# Patient Record
Sex: Female | Born: 1985 | Race: White | Hispanic: No | Marital: Married | Smoking: Never smoker
Health system: Southern US, Community
[De-identification: ages and names within clinical notes are randomized; demographics above are authoritative.]

## PROBLEM LIST (undated history)

## (undated) DIAGNOSIS — I10 Essential (primary) hypertension: Secondary | ICD-10-CM

---

## 2009-02-11 ENCOUNTER — Ambulatory Visit: Payer: Self-pay | Admitting: Family Medicine

## 2009-02-11 DIAGNOSIS — J309 Allergic rhinitis, unspecified: Secondary | ICD-10-CM | POA: Insufficient documentation

## 2009-02-11 DIAGNOSIS — F411 Generalized anxiety disorder: Secondary | ICD-10-CM | POA: Insufficient documentation

## 2009-02-11 DIAGNOSIS — E0590 Thyrotoxicosis, unspecified without thyrotoxic crisis or storm: Secondary | ICD-10-CM | POA: Insufficient documentation

## 2009-02-11 DIAGNOSIS — J069 Acute upper respiratory infection, unspecified: Secondary | ICD-10-CM | POA: Insufficient documentation

## 2009-02-11 DIAGNOSIS — M940 Chondrocostal junction syndrome [Tietze]: Secondary | ICD-10-CM | POA: Insufficient documentation

## 2009-02-11 DIAGNOSIS — I10 Essential (primary) hypertension: Secondary | ICD-10-CM | POA: Insufficient documentation

## 2009-05-22 ENCOUNTER — Ambulatory Visit: Payer: Self-pay | Admitting: Occupational Medicine

## 2009-05-22 DIAGNOSIS — J45901 Unspecified asthma with (acute) exacerbation: Secondary | ICD-10-CM | POA: Insufficient documentation

## 2009-11-09 ENCOUNTER — Ambulatory Visit: Payer: Self-pay | Admitting: Family Medicine

## 2009-11-09 DIAGNOSIS — M26629 Arthralgia of temporomandibular joint, unspecified side: Secondary | ICD-10-CM | POA: Insufficient documentation

## 2009-11-09 DIAGNOSIS — J0100 Acute maxillary sinusitis, unspecified: Secondary | ICD-10-CM | POA: Insufficient documentation

## 2009-11-09 DIAGNOSIS — J029 Acute pharyngitis, unspecified: Secondary | ICD-10-CM | POA: Insufficient documentation

## 2009-11-09 LAB — CONVERTED CEMR LAB: Rapid Strep: NEGATIVE

## 2009-11-10 ENCOUNTER — Encounter: Payer: Self-pay | Admitting: Family Medicine

## 2009-11-19 ENCOUNTER — Ambulatory Visit: Payer: Self-pay | Admitting: Emergency Medicine

## 2009-11-21 ENCOUNTER — Encounter: Admission: RE | Admit: 2009-11-21 | Discharge: 2009-11-21 | Payer: Self-pay | Admitting: Family Medicine

## 2009-12-21 ENCOUNTER — Ambulatory Visit: Payer: Self-pay | Admitting: Family Medicine

## 2009-12-21 DIAGNOSIS — M79609 Pain in unspecified limb: Secondary | ICD-10-CM | POA: Insufficient documentation

## 2009-12-22 ENCOUNTER — Encounter: Payer: Self-pay | Admitting: Family Medicine

## 2010-03-05 ENCOUNTER — Ambulatory Visit: Payer: Self-pay | Admitting: Emergency Medicine

## 2010-03-05 DIAGNOSIS — S060X0A Concussion without loss of consciousness, initial encounter: Secondary | ICD-10-CM | POA: Insufficient documentation

## 2010-03-10 ENCOUNTER — Telehealth (INDEPENDENT_AMBULATORY_CARE_PROVIDER_SITE_OTHER): Payer: Self-pay

## 2010-04-11 ENCOUNTER — Encounter: Payer: Self-pay | Admitting: Emergency Medicine

## 2010-05-15 NOTE — Assessment & Plan Note (Signed)
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 SULFA ! * LATEX ! * ALOE ! * CATSHistory of Present Illness Chief Complaint: Fever, Chest tightness, feels like needles in chest when coughs, History of Present Illness: Presents with complaints of chest tightness and wheezing after going to a party at her friends house with a cat.   She has a previous history of cat allergy.   Benedryl and albuterol MDI have not helped.    Lots of dry coughing.  Some minor sinus congestion.   She does have a history of mild intermittent asthma.   Current Meds ALPRAZOLAM 0.25 MG TABS (ALPRAZOLAM) as needed HYDROCHLOROTHIAZIDE 25 MG TABS (HYDROCHLOROTHIAZIDE) 1 tab by mouth once daily VALACYCLOVIR HCL 500 MG TABS (VALACYCLOVIR HCL) AS needed PROAIR HFA 108 (90 BASE) MCG/ACT AERS (ALBUTEROL SULFATE) Two inhalations q4-6hr as needed.  Max 12 puffs/day PREDNISONE 20 MG TABS (PREDNISONE) 60mg  by mouth once daily for 2 days then 40 mg by mouth once daily for 2 days then 20 mg by mouth once daily for 2 days.  REVIEW OF SYSTEMS Constitutional Symptoms       Complains of fever.     Denies chills, night sweats, weight loss, weight gain, and fatigue.  Eyes       Complains of glasses.      Denies change in vision, eye pain, eye discharge, contact lenses, and eye surgery. Ear/Nose/Throat/Mouth       Complains of hoarseness.      Denies hearing loss/aids, change in hearing, ear pain, ear discharge, dizziness,  frequent runny nose, frequent nose bleeds, sinus problems, sore throat, and tooth pain or bleeding.  Respiratory       Complains of dry cough and shortness of breath.      Denies productive cough, wheezing, asthma, bronchitis, and emphysema/COPD.  Cardiovascular       Complains of chest pain.      Denies murmurs and tires easily with exhertion.    Gastrointestinal       Denies stomach pain, nausea/vomiting, diarrhea, constipation, blood in bowel movements, and indigestion. Genitourniary       Denies painful urination, kidney stones, and loss of urinary control. Neurological       Denies paralysis, seizures, and fainting/blackouts. Musculoskeletal       Denies muscle pain, joint pain, joint stiffness, decreased range of motion, redness, swelling, muscle weakness, and gout.  Skin       Denies bruising, unusual mles/lumps or sores, and hair/skin or nail changes.  Psych       Denies mood changes, temper/anger issues, anxiety/stress, speech problems, depression, and sleep problems.  Past History:  Past Medical History: Reviewed history from 02/11/2009 and no changes  required. Allergic rhinitis Cold Sores Anxiety Hypertension  Past Surgical History: Reviewed history from 02/11/2009 and no changes required. Oral Surgery  Family History: Reviewed history from 02/11/2009 and no changes required.   Family History Hypertension Family History Diabetes 1st degree relative  Social History: Reviewed history from 02/11/2009 and no changes required. Single Never Smoked Alcohol use-yes 1 drink weekly Drug use-no Regular exercise-yes Physical Exam General appearance: well developed, well nourished, no acute distress Head: normocephalic, atraumatic Nasal: mucosa pink, nonedematous, no septal deviation, turbinates normal Oral/Pharynx: tongue normal, posterior pharynx without erythema or exudate Chest/Lungs: no rales, wheezes, or rhonchi bilateral, breath sounds equal without  effort Heart: regular rate and  rhythm, no murmur Assessment New Problems: ASTHMA UNSPECIFIED WITH EXACERBATION (ICD-493.92)   Plan New Medications/Changes: PREDNISONE 20 MG TABS (PREDNISONE) 60mg  by mouth once daily for 2 days then 40 mg by mouth once daily for 2 days then 20 mg by mouth once daily for 2 days.  #QS x 0, 05/22/2009, Kathrine Haddock MD  New Orders: Est. Patient Level III 502 150 5169 Planning Comments:   Zyrtec as dirrected (already has at home) Continue albuterol MDI as needed Prednisone taper Follow up as needed.   The patient and/or caregiver has been counseled thoroughly with regard to medications prescribed including dosage, schedule, interactions, rationale for use, and possible side effects and they verbalize understanding.  Diagnoses and expected course of recovery discussed and will return if not improved as expected or if the condition worsens. Patient and/or caregiver verbalized understanding.  Prescriptions: PREDNISONE 20 MG TABS (PREDNISONE) 60mg  by mouth once daily for 2 days then 40 mg by mouth once daily for 2 days then 20 mg by mouth once daily for 2 days.  #QS x 0   Entered and Authorized by:   Kathrine Haddock MD   Signed by:   Kathrine Haddock MD on 05/22/2009   Method used:   Print then Give to Patient   RxID:   [id number]

## 2010-05-15 NOTE — Assessment & Plan Note (Signed)
Summary: Ear pain-B, HA, Cough-green, sorethroat x 4 dys rm 2   Vital Signs:  Patient Profile:   25 Years Old Female CC:      Cold & URI symptoms Height:     65 inches Weight:      190 pounds O2 Sat:      99 % O2 treatment:    154Room Air Temp:     99.8 degrees F oral Pulse rate:   104 / minute Pulse rhythm:   irregular Resp:     16 per minute BP sitting:   154 / 89  (right arm) Cuff size:   regular  Vitals Entered By: Areta Haber CMA (November 09, 2009 6:46 PM)                  Current Allergies: ! SULFA ! * LATEX ! * ALOE ! * CATS       History of Present Illness Chief Complaint: Cold & URI symptoms History of Present Illness:  Subjective: Patient complains of developing a sore throat one week ago that has persisted, followed by sinus congestion and bilateral earache, worse on the right.  She developed a cough 4 days ago, worse at night.  She states that she has had mono in the past.   No pleuritic pain No wheezing + post-nasal drainage + sinus pain/pressure No itchy/red eyes No hemoptysis No SOB No fever, + chills No nausea No vomiting No abdominal pain No diarrhea No skin rashes + fatigue No myalgias + frontal headache Used OTC meds without relief   Current Problems: TMJ PAIN (ICD-524.62) ACUTE MAXILLARY SINUSITIS (ICD-461.0) ACUTE PHARYNGITIS (ICD-462) ASTHMA UNSPECIFIED WITH EXACERBATION (ICD-493.92) URI (ICD-465.9) COSTOCHONDRITIS, ACUTE (ICD-733.6) FAMILY HISTORY DIABETES 1ST DEGREE RELATIVE (ICD-V18.0) FAMILY HISTORY DEPRESSION (ICD-V17.0) HYPERTENSION (ICD-401.9) ANXIETY (ICD-300.00) HYPERTHYROIDISM (ICD-242.90) ALLERGIC RHINITIS (ICD-477.9)   Current Meds ALPRAZOLAM 0.25 MG TABS (ALPRAZOLAM) as needed HYDROCHLOROTHIAZIDE 25 MG TABS (HYDROCHLOROTHIAZIDE) 1 tab by mouth once daily VALACYCLOVIR HCL 500 MG TABS (VALACYCLOVIR HCL) AS needed DIMETAPP COLD/ALLERGY 1-2.5 MG/5ML ELIX (BROMPHENIRAMINE-PHENYLEPHRINE) as  directed ROBITUSSIN DM 100-10 MG/5ML SYRP (DEXTROMETHORPHAN-GUAIFENESIN) as directed ADVIL 200 MG TABS (IBUPROFEN) as directed AMOXICILLIN 875 MG TABS (AMOXICILLIN) One by mouth two times a day BENZONATATE 200 MG CAPS (BENZONATATE) One by mouth hs as needed cough PREDNISONE 10 MG TABS (PREDNISONE) 2 PO BID for 2 days, then 1 BID for 2 days, then 1 daily for 2 days.  Take PC  REVIEW OF SYSTEMS Constitutional Symptoms      Denies fever, chills, night sweats, weight loss, weight gain, and fatigue.  Eyes       Denies change in vision, eye pain, eye discharge, glasses, contact lenses, and eye surgery. Ear/Nose/Throat/Mouth       Complains of ear pain, sore throat, and hoarseness.      Denies hearing loss/aids, change in hearing, ear discharge, dizziness, frequent runny nose, frequent nose bleeds, sinus problems, and tooth pain or bleeding.      Comments: B x 4 dys Respiratory       Complains of productive cough.      Denies dry cough, wheezing, shortness of breath, asthma, bronchitis, and emphysema/COPD.  Cardiovascular       Denies murmurs, chest pain, and tires easily with exhertion.    Gastrointestinal       Denies stomach pain, nausea/vomiting, diarrhea, constipation, blood in bowel movements, and indigestion. Genitourniary       Denies painful urination, kidney stones, and loss of urinary control. Neurological  Complains of headaches.      Denies paralysis, seizures, and fainting/blackouts. Musculoskeletal       Denies muscle pain, joint pain, joint stiffness, decreased range of motion, redness, swelling, muscle weakness, and gout.  Skin       Denies bruising, unusual mles/lumps or sores, and hair/skin or nail changes.  Psych       Denies mood changes, temper/anger issues, anxiety/stress, speech problems, depression, and sleep problems. Other Comments: greenish yellowx 4 dys. Pt has not seen her PCP for this.   Past History:  Past Medical History: Last updated:  02/11/2009 Allergic rhinitis Cold Sores Anxiety Hypertension  Past Surgical History: Last updated: 02/11/2009 Oral Surgery  Family History: Last updated: 02/11/2009   Family History Hypertension Family History Diabetes 1st degree relative  Social History: Last updated: 02/11/2009 Single Never Smoked Alcohol use-yes 1 drink weekly Drug use-no Regular exercise-yes  Risk Factors: Exercise: yes (02/11/2009)  Risk Factors: Smoking Status: never (02/11/2009)   Objective:  No acute distress  Eyes:  Pupils are equal, round, and reactive to light and accomdation.  Extraocular movement is intact.  Conjunctivae are not inflamed.  Ears:  Canals normal.  Tympanic membranes normal.  Bilateral TMJ tenderness, distinctly worse on the right Nose:  Congested turbinates.  Bilateral maxillary sinus tenderness present.  Pharynx:  Mildly erythematous Neck:  Supple.  Tender shotty anterior/posterior nodes are palpated bilaterally.  Lungs:  Clear to auscultation.  Breath sounds are equal.  Heart:  Regular rate and rhythm without murmurs, rubs, or gallops.  Abdomen:   Tenderness over spleen without masses or hepatosplenomegaly.  Bowel sounds are present.  No CVA or flank tenderness.  Extremities:  No edema.   Skin:  No rash Assessment New Problems: TMJ PAIN (ICD-524.62) ACUTE MAXILLARY SINUSITIS (ICD-461.0) ACUTE PHARYNGITIS (ICD-462)  SUSPECT VIRAL URI WITH REACTIVATED MONO.  SECONDARY SINUSITIS PRESENT.  BILATERAL TMJ INFLAMMATION  Plan New Medications/Changes: PREDNISONE 10 MG TABS (PREDNISONE) 2 PO BID for 2 days, then 1 BID for 2 days, then 1 daily for 2 days.  Take PC  #14 x 0, 11/09/2009, Donna Christen MD BENZONATATE 200 MG CAPS (BENZONATATE) One by mouth hs as needed cough  #12 x 0, 11/09/2009, Donna Christen MD AMOXICILLIN 875 MG TABS (AMOXICILLIN) One by mouth two times a day  #20 x 0, 11/09/2009, Donna Christen MD  New Orders: Rapid Strep [87880] T-Culture, Throat  [[id number]] T-Culture, Throat [[id number]] Est. Patient Level IV [06301] Planning Comments:   Throat culture pending Begin amoxicillin and tapering course of prednisone.  Expectorant and topical decongestant.  Cough suppressant at bedtime. Apply ice pack to TMJ several times daily.  Given a Water quality scientist patient information and instruction sheet on topic TMJ.  Recommend follow-up visit with dentist. Advised to resume her medication for hypertension. Follow-up with PCP if not improving.   The patient and/or caregiver has been counseled thoroughly with regard to medications prescribed including dosage, schedule, interactions, rationale for use, and possible side effects and they verbalize understanding.  Diagnoses and expected course of recovery discussed and will return if not improved as expected or if the condition worsens. Patient and/or caregiver verbalized understanding.  Prescriptions: PREDNISONE 10 MG TABS (PREDNISONE) 2 PO BID for 2 days, then 1 BID for 2 days, then 1 daily for 2 days.  Take PC  #14 x 0   Entered and Authorized by:   Donna Christen MD   Signed by:   Donna Christen MD on 11/09/2009   Method used:   Print then  Give to Patient   RxID:   [id number] BENZONATATE 200 MG CAPS (BENZONATATE) One by mouth hs as needed cough  #12 x 0   Entered and Authorized by:   Donna Christen MD   Signed by:   Donna Christen MD on 11/09/2009   Method used:   Print then Give to Patient   RxID:   [id number] AMOXICILLIN 875 MG TABS (AMOXICILLIN) One by mouth two times a day  #20 x 0   Entered and Authorized by:   Donna Christen MD   Signed by:   Donna Christen MD on 11/09/2009   Method used:   Print then Give to Patient   RxID:   [id number]   Patient Instructions: 1)  Take Mucinex  (guaifenesin) twice daily for congestion. 2)  Increase fluid intake, rest. 3)  May use Afrin nasal spray (or generic oxymetazoline) twice daily for about 5 days.  Also recommend using saline nasal spray  several times daily and/or saline nasal irrigation. 4)  Followup with family doctor if not improving 7 to 10 days.  Orders Added: 1)  Rapid Strep [87880] 2)  T-Culture, Throat [[id number]] 3)  T-Culture, Throat [[id number]] 4)  Est. Patient Level IV [50093]  Laboratory Results  Date/Time Received: November 09, 2009 7:55 PM  Date/Time Reported: November 09, 2009 7:55 PM   Other Tests  Rapid Strep: negative

## 2010-05-15 NOTE — Assessment & Plan Note (Signed)
Summary: HA x 2 dys rm 5   Vital Signs:  Patient Profile:   25 Years Old Female CC:      Head pain x 2 dys Height:     65 inches Weight:      197 pounds O2 Sat:      98 % O2 treatment:    Room Air Temp:     99.2 degrees F oral Pulse rate:   78 / minute Pulse rhythm:   regular Resp:     16 per minute BP sitting:   127 / 84  (left arm) Cuff size:   regular  Vitals Entered By: Areta Haber CMA (March 05, 2010 3:56 PM)              Vision Screening: Left eye with correction: 20 / 25 Right eye with correction: 20 / 25 Both eyes with correction: 20 / 25        Vision Entered By: Areta Haber CMA (March 05, 2010 4:18 PM)    Current Allergies: ! SULFA ! * LATEX ! * ALOE ! * CATS       History of Present Illness History from: patient Chief Complaint: Head pain x 2 dys History of Present Illness: Patient was wrestling with her boyfriend 2 nights ago and hit her head on the bottom of a cabinet when she was getting up.  Hit the top of of her head and has had a headache since.  It is getting a little better but she also has other symptoms that she is concerned with: insomnia, nausea, diarrhea, photophobia, difficulty concentrating, etc.  She has never had a concussion before, no previous neck or head injuries.  There was no LOC. She has no numbness, weakness, or visual symptoms.  Current Problems: CONCUSSION WITH NO LOSS OF CONSCIOUSNESS (ICD-850.0) FOOT PAIN, LEFT (ICD-729.5) TMJ PAIN (ICD-524.62) ACUTE MAXILLARY SINUSITIS (ICD-461.0) ACUTE PHARYNGITIS (ICD-462) ASTHMA UNSPECIFIED WITH EXACERBATION (ICD-493.92) URI (ICD-465.9) COSTOCHONDRITIS, ACUTE (ICD-733.6) FAMILY HISTORY DIABETES 1ST DEGREE RELATIVE (ICD-V18.0) FAMILY HISTORY DEPRESSION (ICD-V17.0) HYPERTENSION (ICD-401.9) ANXIETY (ICD-300.00) HYPERTHYROIDISM (ICD-242.90) ALLERGIC RHINITIS (ICD-477.9)   Current Meds ALPRAZOLAM 0.25 MG TABS (ALPRAZOLAM) as needed HYDROCHLOROTHIAZIDE 25 MG  TABS (HYDROCHLOROTHIAZIDE) 1 tab by mouth once daily VALACYCLOVIR HCL 500 MG TABS (VALACYCLOVIR HCL) AS needed ADVIL 200 MG TABS (IBUPROFEN) as directed NAPROXEN 500 MG TABS (NAPROXEN) One by mouth two times a day pc  REVIEW OF SYSTEMS Constitutional Symptoms      Denies fever, chills, night sweats, weight loss, weight gain, and fatigue.  Eyes       Complains of eye pain.      Denies change in vision, eye discharge, glasses, contact lenses, and eye surgery.      Comments: R x 2dys Ear/Nose/Throat/Mouth       Denies hearing loss/aids, change in hearing, ear pain, ear discharge, dizziness, frequent runny nose, frequent nose bleeds, sinus problems, sore throat, hoarseness, and tooth pain or bleeding.  Respiratory       Denies dry cough, productive cough, wheezing, shortness of breath, asthma, bronchitis, and emphysema/COPD.  Cardiovascular       Denies murmurs, chest pain, and tires easily with exhertion.    Gastrointestinal       Complains of diarrhea.      Denies stomach pain, nausea/vomiting, constipation, blood in bowel movements, and indigestion.      Comments: mild Genitourniary       Denies painful urination, kidney stones, and loss of urinary control. Neurological  Complains of headaches.      Denies paralysis, seizures, and fainting/blackouts. Musculoskeletal       Denies muscle pain, joint pain, joint stiffness, decreased range of motion, redness, swelling, muscle weakness, and gout.  Skin       Denies bruising, unusual mles/lumps or sores, and hair/skin or nail changes.  Psych       Denies mood changes, temper/anger issues, anxiety/stress, speech problems, depression, and sleep problems. Other Comments: Pt states that she was wrestling with boyfriend on Saturday night, 03/03/10, hit the center of her head on cabinet. Pt states she feels as if she's having to make her R eye stay open. Pt has not seen her PCP or went to ER for evaluation.   Past History:  Past Medical  History: Last updated: 02/11/2009 Allergic rhinitis Cold Sores Anxiety Hypertension  Past Surgical History: Last updated: 02/11/2009 Oral Surgery  Family History: Last updated: 02/11/2009   Family History Hypertension Family History Diabetes 1st degree relative  Social History: Last updated: 02/11/2009 Single Never Smoked Alcohol use-yes 1 drink weekly Drug use-no Regular exercise-yes  Risk Factors: Exercise: yes (02/11/2009)  Risk Factors: Smoking Status: never (02/11/2009) Physical Exam General appearance: well developed, well nourished, no acute distress Head: normocephalic, atraumatic.  No bumps or bruises noticed. Pupils: PERRLA, EOMI Ears: normal, no lesions or deformities Nasal: mucosa pink, nonedematous, no septal deviation, turbinates normal Oral/Pharynx: tongue normal, posterior pharynx without erythema or exudate Neck: FROM, spurling neg. Chest/Lungs: no rales, wheezes, or rhonchi bilateral, breath sounds equal without effort Heart: regular rate and  rhythm, no murmur Neurological: grossly intact and non-focal.  CN2-12 intact.  Normal strength and sensation all 4 extremities. Skin: no obvious rashes or lesions MSE: oriented to time, place, and person SCAT2 partial concussion evaluation (no baseline is available): Symptoms 18/22 for a total of 38.  Unable to do a proper BESS balance assessment, but gait and balance appear normal.  GCS is 15/15.  Her cognitive assessment showed no obvious impairment with memory or orientation or concentration. Assessment New Problems: CONCUSSION WITH NO LOSS OF CONSCIOUSNESS (ICD-850.0)   Patient Education: Patient and/or caregiver instructed in the following: rest, fluids, Tylenol prn.  Plan New Orders: Est. Patient Level IV [30865] Planning Comments:   Advised patient to rest as much as possible over the next few days. 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  Avoid  stress (emotional, mental, cognitive).  Use Tylenol as needed, can see if caffeine helps as well. For insomnia, suggest Benedryl +/- Melatonin.  If can't sleep still, we will call in "Ambien 5mg  at bedtime as needed for insomnia, #5, no refills".  Her symptoms should slowly improve.  If still not improving substatially in 1 week, patient to be referred to neurology.  If any further worsening of symptoms (HA, confusion, weakness, etc), go directly to ER.   The patient and/or caregiver has been counseled thoroughly with regard to medications prescribed including dosage, schedule, interactions, rationale for use, and possible side effects and they verbalize understanding.  Diagnoses and expected course of recovery discussed and will return if not improved as expected or if the condition worsens. Patient and/or caregiver verbalized understanding.   Orders Added: 1)  Est. Patient Level IV [78469]

## 2010-05-15 NOTE — Progress Notes (Signed)
Summary: Courtesy call  Phone Note Outgoing Call   Call placed by: Areta Haber CMA,  March 10, 2010 12:31 PM Call placed to: Patient Summary of Call: Courtesy call to pt - Courtesy mess LOVM of cell number. Initial call taken by: Areta Haber CMA,  March 10, 2010 12:32 PM

## 2010-05-15 NOTE — Assessment & Plan Note (Signed)
Summary: L foot pain x 2 dys rm 5   Vital Signs:  Patient Profile:   25 Years Old Female CC:      L foot pain x 2 dys Height:     65 inches Weight:      193 pounds O2 Sat:      99 % O2 treatment:    Room Air Temp:     99.1 degrees F oral Pulse rate:   82 / minute Pulse rhythm:   regular Resp:     16 per minute BP sitting:   144 / 87  (left arm) Cuff size:   large  Vitals Entered By: Areta Haber CMA (December 21, 2009 4:35 PM)                  Current Allergies: ! SULFA ! * LATEX ! * ALOE ! * CATS      History of Present Illness Chief Complaint: L foot pain x 2 dys History of Present Illness:    Subjective:  Patient awoke two days ago with pain in her left foot that has persisted.  The pain is worse with weight bearing.  No known trauma.  No recent change in activities or new footwear.  She has a family history of gout in a paternal uncle.   Current Problems: FOOT PAIN, LEFT (ICD-729.5) TMJ PAIN (ICD-524.62) ACUTE MAXILLARY SINUSITIS (ICD-461.0) ACUTE PHARYNGITIS (ICD-462) ASTHMA UNSPECIFIED WITH EXACERBATION (ICD-493.92) URI (ICD-465.9) COSTOCHONDRITIS, ACUTE (ICD-733.6) FAMILY HISTORY DIABETES 1ST DEGREE RELATIVE (ICD-V18.0) FAMILY HISTORY DEPRESSION (ICD-V17.0) HYPERTENSION (ICD-401.9) ANXIETY (ICD-300.00) HYPERTHYROIDISM (ICD-242.90) ALLERGIC RHINITIS (ICD-477.9)   Current Meds ALPRAZOLAM 0.25 MG TABS (ALPRAZOLAM) as needed HYDROCHLOROTHIAZIDE 25 MG TABS (HYDROCHLOROTHIAZIDE) 1 tab by mouth once daily VALACYCLOVIR HCL 500 MG TABS (VALACYCLOVIR HCL) AS needed ADVIL 200 MG TABS (IBUPROFEN) as directed NAPROXEN 500 MG TABS (NAPROXEN) One by mouth two times a day pc  REVIEW OF SYSTEMS Constitutional Symptoms      Denies fever, chills, night sweats, weight loss, weight gain, and fatigue.  Eyes       Denies change in vision, eye pain, eye discharge, glasses, contact lenses, and eye surgery. Ear/Nose/Throat/Mouth       Denies hearing loss/aids,  change in hearing, ear pain, ear discharge, dizziness, frequent runny nose, frequent nose bleeds, sinus problems, sore throat, hoarseness, and tooth pain or bleeding.  Respiratory       Denies dry cough, productive cough, wheezing, shortness of breath, asthma, bronchitis, and emphysema/COPD.  Cardiovascular       Denies murmurs, chest pain, and tires easily with exhertion.    Gastrointestinal       Denies stomach pain, nausea/vomiting, diarrhea, constipation, blood in bowel movements, and indigestion. Genitourniary       Denies painful urination, kidney stones, and loss of urinary control. Neurological       Denies paralysis, seizures, and fainting/blackouts. Musculoskeletal       Complains of muscle pain, decreased range of motion, redness, and swelling.      Denies joint pain, joint stiffness, muscle weakness, and gout.      Comments: L foot pain x 2 dys Skin       Denies bruising, unusual mles/lumps or sores, and hair/skin or nail changes.  Psych       Denies mood changes, temper/anger issues, anxiety/stress, speech problems, depression, and sleep problems. Other Comments: Pt states she has not done anything to have L foot pain. Pt has not seen her PCP for this.   Past History:  Past Medical History: Last updated: 02/11/2009 Allergic rhinitis Cold Sores Anxiety Hypertension  Past Surgical History: Last updated: 02/11/2009 Oral Surgery  Family History: Last updated: 02/11/2009   Family History Hypertension Family History Diabetes 1st degree relative  Social History: Last updated: 02/11/2009 Single Never Smoked Alcohol use-yes 1 drink weekly Drug use-no Regular exercise-yes  Risk Factors: Exercise: yes (02/11/2009)  Risk Factors: Smoking Status: never (02/11/2009)   Objective:  No acute distress  Left ankle:  Full range of motion without tenderness Left foot:  Diffuse tenderness throughout forefoot, especially when squeezing foot.  Very mild swelling.   Possibly some increased warmth.  Distal neurovascular intact  X-ray left foot:  Soft tissue swelling but no bony abnormality. CBC:  WBC 9.5; Hgb 12.9 Assessment New Problems: FOOT PAIN, LEFT (ICD-729.5)  SUSPECT ACUTE GOUT.  ? METATARSALGIA  Plan New Medications/Changes: NAPROXEN 500 MG TABS (NAPROXEN) One by mouth two times a day pc  #20 x 1, 12/21/2009, Donna Christen MD  New Orders: T-DG Foot Complete*L* [73630] CBC w/Diff [[address]109-60454] T-Uric Acid (Blood) [09811-91478] Est. Patient Level III [29562] Planning Comments:   Begin Naproxen.  Apply ice pack as needed.  Wear well-fitting comfortable shoes with arch support ([employer] information and instruction patient handout given,  and a [employer] patient information and instruction sheet on topic gout) Uric Acid pending. Follow-up with PCP if not improving.   The patient and/or caregiver has been counseled thoroughly with regard to medications prescribed including dosage, schedule, interactions, rationale for use, and possible side effects and they verbalize understanding.  Diagnoses and expected course of recovery discussed and will return if not improved as expected or if the condition worsens. Patient and/or caregiver verbalized understanding.  Prescriptions: NAPROXEN 500 MG TABS (NAPROXEN) One by mouth two times a day pc  #20 x 1   Entered and Authorized by:   Donna Christen MD   Signed by:   Donna Christen MD on 12/21/2009   Method used:   Print then Give to Patient   RxID:   [id number]   Orders Added: 1)  T-DG Foot Complete*L* [73630] 2)  CBC w/Diff [84132-44010] 3)  T-Uric Acid (Blood) [27253-66440] 4)  Est. Patient Level III [34742]

## 2010-05-15 NOTE — Assessment & Plan Note (Signed)
Summary: Sinus pressure, cough-greenish x 3 dys  rm 3   Vital Signs:  Patient Profile:   25 Years Old Female CC:      Cold & URI symptoms Height:     65 inches Weight:      191 pounds O2 Sat:      99 % O2 treatment:    Room Air Temp:     98.2 degrees F oral Pulse rate:   118 / minute Pulse rhythm:   regular Resp:     16 per minute BP sitting:   179 / 98  (right arm) Cuff size:   regular  Vitals Entered By: Areta Haber CMA (November 19, 2009 3:06 PM)                  Current Allergies: ! SULFA ! * LATEX ! * ALOE ! * CATS     History of Present Illness Chief Complaint: Cold & URI symptoms History of Present Illness: 3 weeks sinus infection.  Starting getting better, then got worse.  Pain has migrated from throat to face, forehead, sinuses.  No fever, chills, N/V.  +Coughing.  She took the Amox and the Prednisone but not the Tessalon.  She is taking OTC Advil cold & sinus relief which is helping.  Current Problems: TMJ PAIN (ICD-524.62) ACUTE MAXILLARY SINUSITIS (ICD-461.0) ACUTE PHARYNGITIS (ICD-462) ASTHMA UNSPECIFIED WITH EXACERBATION (ICD-493.92) URI (ICD-465.9) COSTOCHONDRITIS, ACUTE (ICD-733.6) FAMILY HISTORY DIABETES 1ST DEGREE RELATIVE (ICD-V18.0) FAMILY HISTORY DEPRESSION (ICD-V17.0) HYPERTENSION (ICD-401.9) ANXIETY (ICD-300.00) HYPERTHYROIDISM (ICD-242.90) ALLERGIC RHINITIS (ICD-477.9)   Current Meds ALPRAZOLAM 0.25 MG TABS (ALPRAZOLAM) as needed HYDROCHLOROTHIAZIDE 25 MG TABS (HYDROCHLOROTHIAZIDE) 1 tab by mouth once daily VALACYCLOVIR HCL 500 MG TABS (VALACYCLOVIR HCL) AS needed DIMETAPP COLD/ALLERGY 1-2.5 MG/5ML ELIX (BROMPHENIRAMINE-PHENYLEPHRINE) as directed ROBITUSSIN DM 100-10 MG/5ML SYRP (DEXTROMETHORPHAN-GUAIFENESIN) as directed ADVIL 200 MG TABS (IBUPROFEN) as directed AMOXICILLIN 875 MG TABS (AMOXICILLIN) One by mouth two times a day BENZONATATE 200 MG CAPS (BENZONATATE) One by mouth hs as needed cough PREDNISONE 10 MG TABS  (PREDNISONE) 2 PO BID for 2 days, then 1 BID for 2 days, then 1 daily for 2 days.  Take PC AUGMENTIN 875-125 MG TABS (AMOXICILLIN-POT CLAVULANATE) 1 tab by mouth two times a day for 14 days NASONEX 50 MCG/ACT SUSP (MOMETASONE FUROATE) 2 sprays each nostril daily  REVIEW OF SYSTEMS Constitutional Symptoms      Denies fever, chills, night sweats, weight loss, weight gain, and fatigue.  Eyes       Denies change in vision, eye pain, eye discharge, glasses, contact lenses, and eye surgery. Ear/Nose/Throat/Mouth       Complains of sinus problems.      Denies hearing loss/aids, change in hearing, ear pain, ear discharge, dizziness, frequent runny nose, frequent nose bleeds, sore throat, hoarseness, and tooth pain or bleeding.  Respiratory       Complains of productive cough.      Denies dry cough, wheezing, shortness of breath, asthma, bronchitis, and emphysema/COPD.  Cardiovascular       Denies murmurs, chest pain, and tires easily with exhertion.    Gastrointestinal       Denies stomach pain, nausea/vomiting, diarrhea, constipation, blood in bowel movements, and indigestion. Genitourniary       Denies painful urination, kidney stones, and loss of urinary control. Neurological       Complains of headaches.      Denies paralysis, seizures, and fainting/blackouts. Musculoskeletal       Denies muscle pain, joint pain, joint  stiffness, decreased range of motion, redness, swelling, muscle weakness, and gout.  Skin       Denies bruising, unusual mles/lumps or sores, and hair/skin or nail changes.  Psych       Denies mood changes, temper/anger issues, anxiety/stress, speech problems, depression, and sleep problems. Other Comments: greenish x 3 dys. pt has not followed up w/PCP.   Past History:  Past Medical History: Last updated: 02/11/2009 Allergic rhinitis Cold Sores Anxiety Hypertension  Past Surgical History: Last updated: 02/11/2009 Oral Surgery  Family History: Last updated:  02/11/2009   Family History Hypertension Family History Diabetes 1st degree relative  Social History: Last updated: 02/11/2009 Single Never Smoked Alcohol use-yes 1 drink weekly Drug use-no Regular exercise-yes  Risk Factors: Exercise: yes (02/11/2009)  Risk Factors: Smoking Status: never (02/11/2009) Physical Exam General appearance: well developed, well nourished, no acute distress Head: maxillary tenderness Bilateral Nasal: clear discharge Oral/Pharynx: clear PND, no erythema Neck: neck supple,  trachea midline, no masses Chest/Lungs: no rales, wheezes, or rhonchi bilateral, breath sounds equal without effort Heart: regular rate and  rhythm, no murmur Skin: no obvious rashes or lesions MSE: oriented to time, place, and person  Patient Education: Patient and/or caregiver instructed in the following: rest, fluids, Ibuprofen prn.  Plan New Medications/Changes: NASONEX 50 MCG/ACT SUSP (MOMETASONE FUROATE) 2 sprays each nostril daily  #1 bottle x 0, 11/19/2009, Hoyt Koch MD AUGMENTIN [phone] MG TABS (AMOXICILLIN-POT CLAVULANATE) 1 tab by mouth two times a day for 14 days  #28 x 0, 11/19/2009, Hoyt Koch MD  New Orders: Est. Patient Level II [phone] Planning Comments:   Call your ENT this week and make an appt  Follow Up: Follow up with Primary Physician  The patient and/or caregiver has been counseled thoroughly with regard to medications prescribed including dosage, schedule, interactions, rationale for use, and possible side effects and they verbalize understanding.  Diagnoses and expected course of recovery discussed and will return if not improved as expected or if the condition worsens. Patient and/or caregiver verbalized understanding.  Prescriptions: NASONEX 50 MCG/ACT SUSP (MOMETASONE FUROATE) 2 sprays each nostril daily  #1 bottle x 0   Entered and Authorized by:   Hoyt Koch MD   Signed by:   Hoyt Koch MD on 11/19/2009   Method  used:   Printed then faxed to ...       [facility]  [address] [phone]* (retail)       [address] [place], [place]  [address]       Ph: [phone] or [phone]       Fax: [phone]   RxID:   [id number] AUGMENTIN 875-125 MG TABS (AMOXICILLIN-POT CLAVULANATE) 1 tab by mouth two times a day for 14 days  #28 x 0   Entered and Authorized by:   Hoyt Koch MD   Signed by:   Hoyt Koch MD on 11/19/2009   Method used:   Printed then faxed to ...       [facility]  [address] [phone]* (retail)       [address] [place], [place]  [address]       Ph: [phone] or [phone]       Fax: [phone]   RxID:   [id number]   Orders Added: 1)  Est. Patient Level II [31517]

## 2010-05-17 NOTE — Letter (Signed)
Summary: Internal Correspondence  Internal Correspondence   Imported By: Dannette Barbara 04/11/2010 08:27:35  _____________________________________________________________________  External Attachment:    Type:   Image     Comment:   External Document

## 2010-09-05 ENCOUNTER — Inpatient Hospital Stay (INDEPENDENT_AMBULATORY_CARE_PROVIDER_SITE_OTHER)
Admission: RE | Admit: 2010-09-05 | Discharge: 2010-09-05 | Disposition: A | Discharge status: Home / Self Care | Payer: BC Managed Care – PPO | Source: Ambulatory Visit | Attending: Family Medicine | Admitting: Family Medicine

## 2010-09-05 ENCOUNTER — Encounter: Payer: Self-pay | Admitting: Family Medicine

## 2010-09-05 DIAGNOSIS — J029 Acute pharyngitis, unspecified: Secondary | ICD-10-CM

## 2010-09-05 DIAGNOSIS — M940 Chondrocostal junction syndrome [Tietze]: Secondary | ICD-10-CM

## 2010-09-05 DIAGNOSIS — J069 Acute upper respiratory infection, unspecified: Secondary | ICD-10-CM

## 2010-09-05 LAB — CONVERTED CEMR LAB: Rapid Strep: NEGATIVE

## 2010-09-07 ENCOUNTER — Telehealth (INDEPENDENT_AMBULATORY_CARE_PROVIDER_SITE_OTHER): Payer: Self-pay | Admitting: *Deleted

## 2010-09-23 IMAGING — CR DG FOOT COMPLETE 3+V*L*
3 series · 3 of 3 positions shown · non-contrast
Comparison: None

CLINICAL DATA: Swelling, pain.  No known injury.

LEFT FOOT - COMPLETE 3+ VIEW

[view not recorded (1 of 3)]
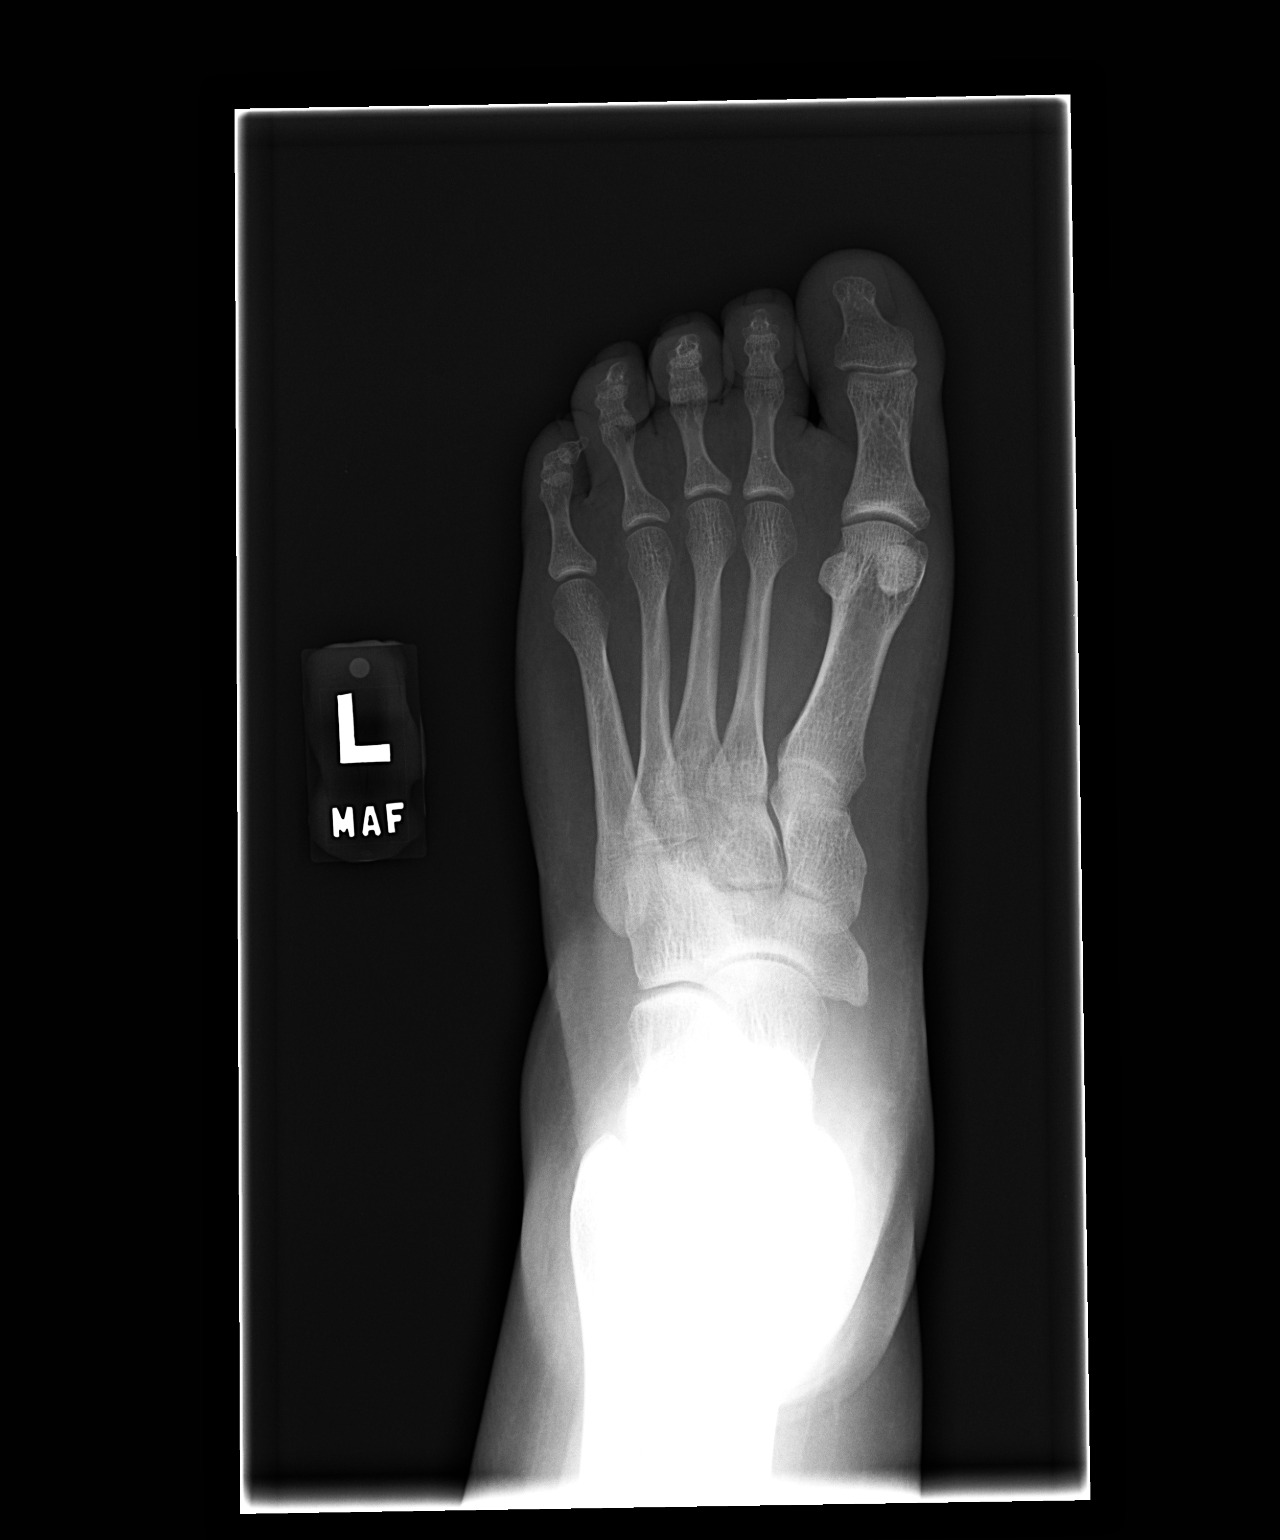

[view not recorded (2 of 3)]
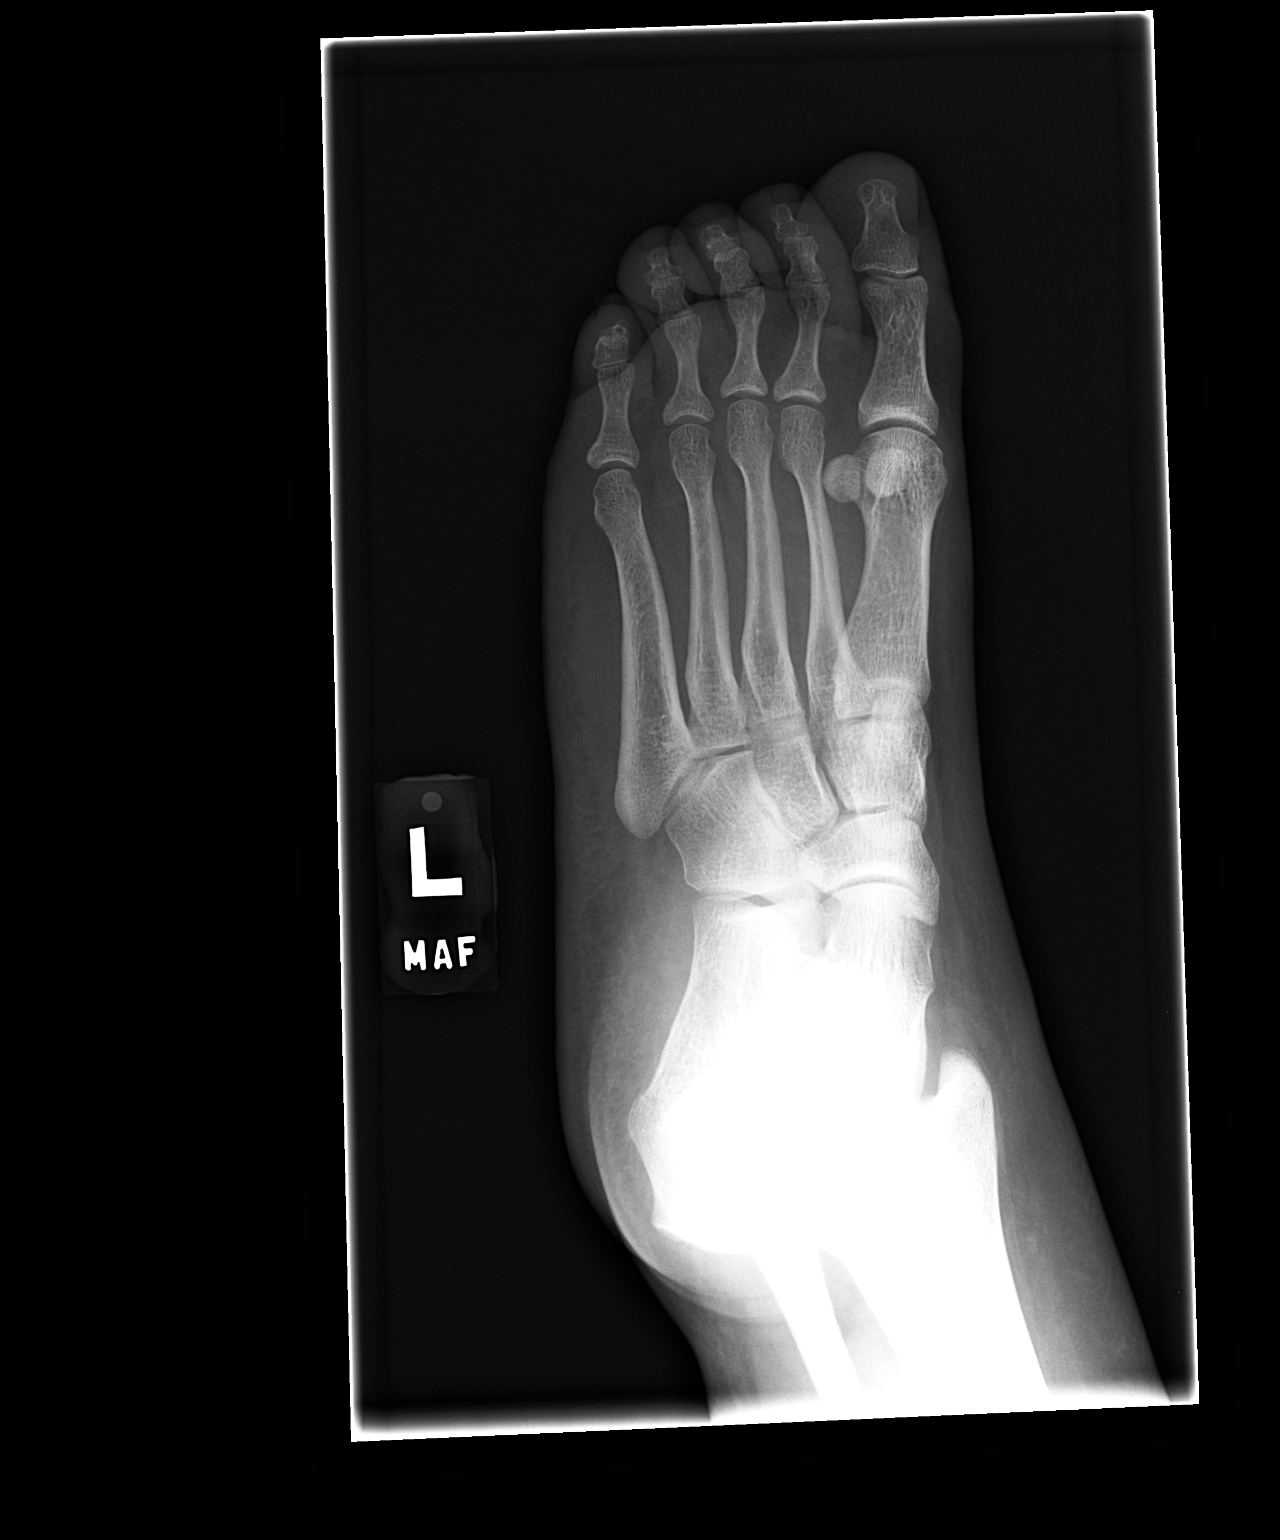

[view not recorded (3 of 3)]
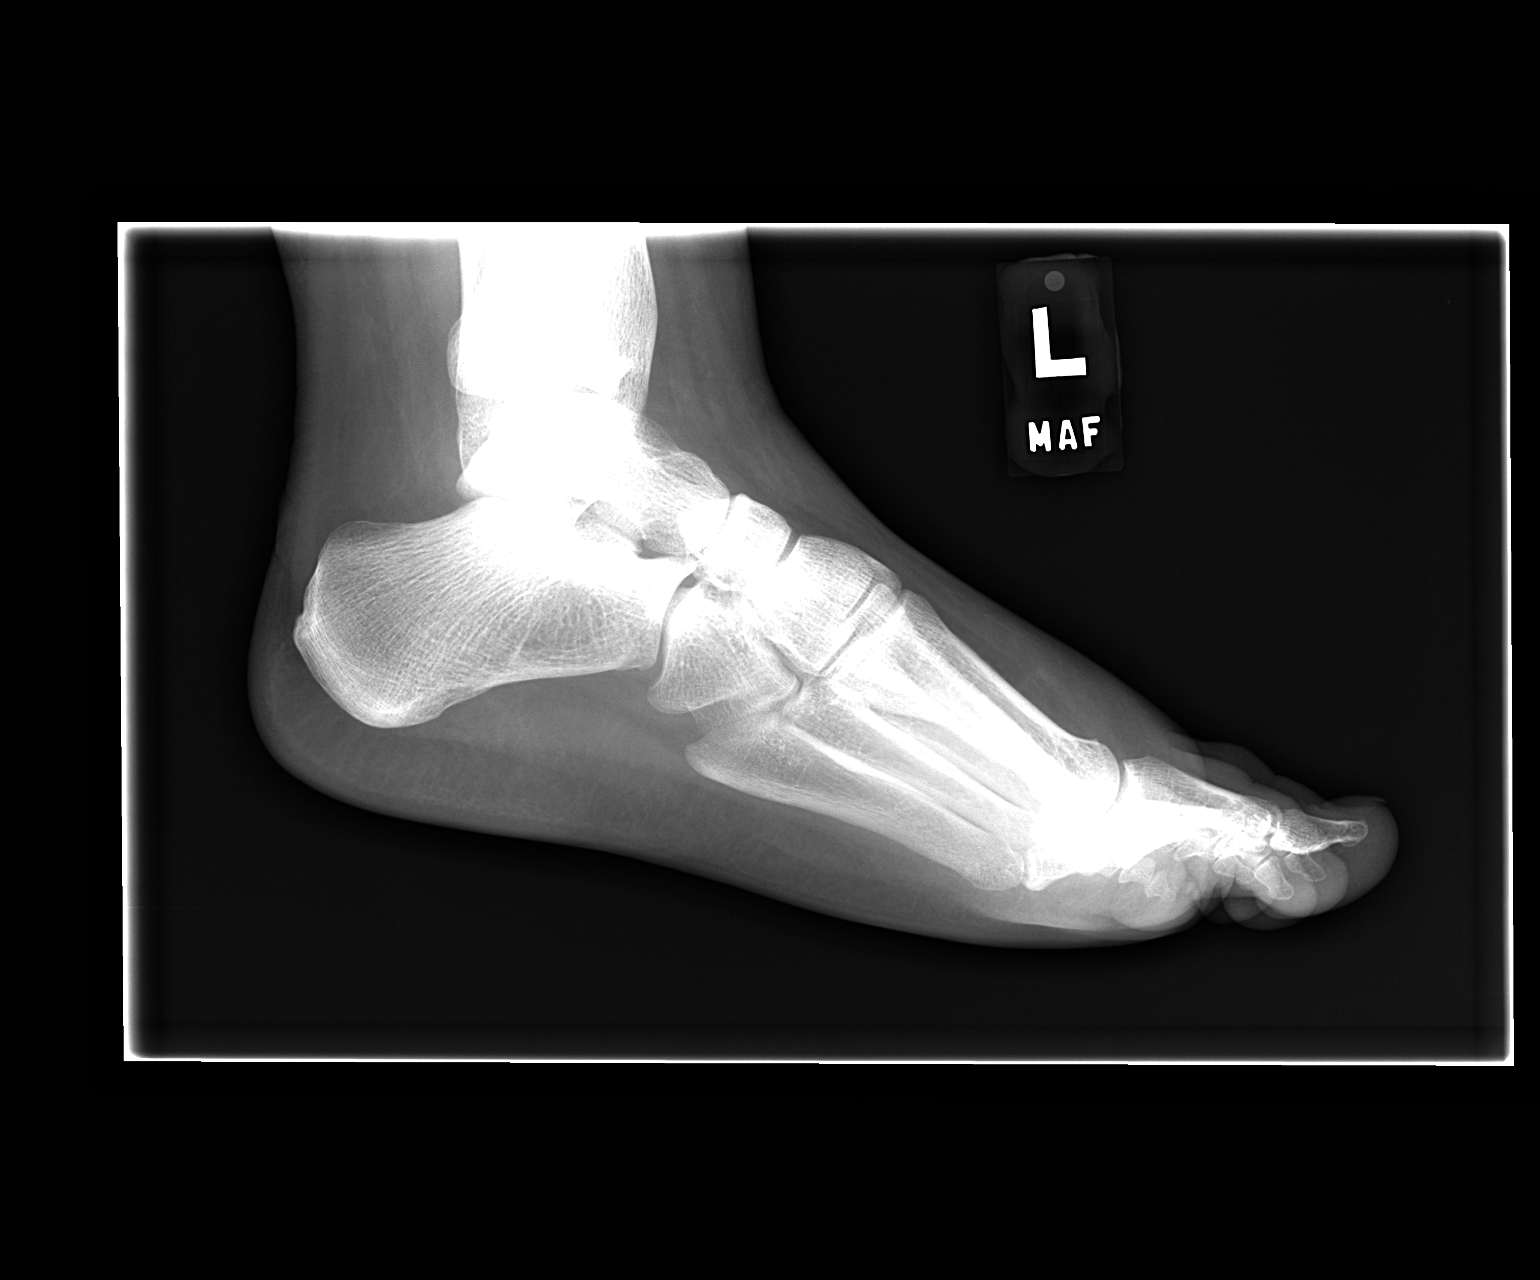

[3 of 3 positions shown; findings below may reference images not displayed]

FINDINGS: There is soft tissue swelling along the dorsum of the
foot.  No evidence for radiopaque foreign body or soft tissue gas
in this region.  No evidence for acute fracture or subluxation.
IMPRESSION: 1.  Soft tissue swelling.
2. No evidence for acute osseous abnormality.

## 2010-11-04 ENCOUNTER — Encounter: Payer: Self-pay | Admitting: Family Medicine

## 2010-11-04 ENCOUNTER — Inpatient Hospital Stay (INDEPENDENT_AMBULATORY_CARE_PROVIDER_SITE_OTHER)
Admission: RE | Admit: 2010-11-04 | Discharge: 2010-11-04 | Disposition: A | Discharge status: Home / Self Care | Payer: BC Managed Care – PPO | Source: Ambulatory Visit | Attending: Family Medicine | Admitting: Family Medicine

## 2010-11-04 DIAGNOSIS — Z87448 Personal history of other diseases of urinary system: Secondary | ICD-10-CM

## 2010-11-04 DIAGNOSIS — R109 Unspecified abdominal pain: Secondary | ICD-10-CM

## 2010-11-04 DIAGNOSIS — R1012 Left upper quadrant pain: Secondary | ICD-10-CM

## 2010-11-04 LAB — CONVERTED CEMR LAB
Beta hcg, urine, semiquantitative: NEGATIVE
Glucose, Urine, Semiquant: NEGATIVE
Nitrite: NEGATIVE
Specific Gravity, Urine: >=1.03
WBC Urine, dipstick: NEGATIVE

## 2010-11-05 ENCOUNTER — Other Ambulatory Visit: Payer: Self-pay | Admitting: Family Medicine

## 2010-11-05 DIAGNOSIS — R109 Unspecified abdominal pain: Secondary | ICD-10-CM

## 2010-11-06 ENCOUNTER — Telehealth: Payer: Self-pay | Admitting: Family Medicine

## 2010-11-06 ENCOUNTER — Ambulatory Visit
Admission: RE | Admit: 2010-11-06 | Discharge: 2010-11-06 | Disposition: A | Discharge status: Home / Self Care | Payer: BC Managed Care – PPO | Source: Ambulatory Visit | Attending: Family Medicine | Admitting: Family Medicine

## 2010-11-06 DIAGNOSIS — R109 Unspecified abdominal pain: Secondary | ICD-10-CM

## 2010-12-25 ENCOUNTER — Encounter: Payer: Self-pay | Admitting: Family Medicine

## 2010-12-25 ENCOUNTER — Inpatient Hospital Stay (INDEPENDENT_AMBULATORY_CARE_PROVIDER_SITE_OTHER)
Admission: RE | Admit: 2010-12-25 | Discharge: 2010-12-25 | Disposition: A | Discharge status: Home / Self Care | Payer: BC Managed Care – PPO | Source: Ambulatory Visit | Attending: Family Medicine | Admitting: Family Medicine

## 2010-12-25 DIAGNOSIS — J069 Acute upper respiratory infection, unspecified: Secondary | ICD-10-CM

## 2010-12-27 ENCOUNTER — Telehealth (INDEPENDENT_AMBULATORY_CARE_PROVIDER_SITE_OTHER): Payer: Self-pay | Admitting: Emergency Medicine

## 2011-03-18 NOTE — Progress Notes (Signed)
Summary: [place] Sinus Infection (rm 4)   Vital Signs:  Patient Profile:   25 Years Old Female CC:      sinus pressure, prod. cough, fatigue x 4 days Height:     65 inches Weight:      196 pounds O2 Sat:      97 % O2 treatment:    Room Air Temp:     98.8 degrees F oral Pulse rate:   86 / minute Resp:     14 per minute BP sitting:   128 / 83  (left arm) Cuff size:   large  Vitals Entered By: Lajean Saver RN (December 25, 2010 9:05 AM)                  Updated Prior Medication List: No Medications Current Allergies (reviewed today): ! SULFA ! * LATEX ! * ALOE ! * CATSHistory of Present Illness Chief Complaint: sinus pressure, prod. cough, fatigue x 4 days History of Present Illness:  Subjective: Patient complains of URI symptoms that started four days ago with fatigue.  She has a history of seasonal allergies + mild sore throat, resolving + cough for two days No pleuritic pain No wheezing + nasal congestion + post-nasal drainage + sinus pain/pressure No itchy/red eyes No earache No hemoptysis No SOB + low grade fever/chills started yesterday No nausea No vomiting No abdominal pain No diarrhea No skin rashes No myalgias No headache Used OTC meds without relief   REVIEW OF SYSTEMS Constitutional Symptoms       Complains of fatigue.     Denies fever, chills, night sweats, weight loss, and weight gain.  Eyes       Denies change in vision, eye pain, eye discharge, glasses, contact lenses, and eye surgery. Ear/Nose/Throat/Mouth       Complains of frequent runny nose and sinus problems.      Denies hearing loss/aids, change in hearing, ear pain, ear discharge, dizziness, frequent nose bleeds, sore throat, hoarseness, and tooth pain or bleeding.  Respiratory       Complains of productive cough.      Denies dry cough, wheezing, shortness of breath, asthma, bronchitis, and emphysema/COPD.  Cardiovascular       Denies murmurs, chest pain, and tires easily  with exhertion.    Gastrointestinal       Denies stomach pain, nausea/vomiting, diarrhea, constipation, blood in bowel movements, and indigestion. Genitourniary       Denies painful urination, kidney stones, and loss of urinary control. Neurological       Complains of headaches.      Denies paralysis, seizures, and fainting/blackouts. Musculoskeletal       Denies muscle pain, joint pain, joint stiffness, decreased range of motion, redness, swelling, muscle weakness, and gout.  Skin       Denies bruising, unusual mles/lumps or sores, and hair/skin or nail changes.  Psych       Denies mood changes, temper/anger issues, anxiety/stress, speech problems, depression, and sleep problems. Other Comments: taken zyrtec, and advil congestion   Past History:  Past Medical History: Reviewed history from 02/11/2009 and no changes required. Allergic rhinitis Cold Sores Anxiety Hypertension  Past Surgical History: Reviewed history from 02/11/2009 and no changes required. Oral Surgery  Family History: Reviewed history from 02/11/2009 and no changes required.   Family History Hypertension Family History Diabetes 1st degree relative  Social History: Reviewed history from 02/11/2009 and no changes required. Single Never Smoked Alcohol use-yes 1 drink weekly Drug use-no  Regular exercise-yes   Objective:  Appearance:  Patient appears healthy, stated age, and in no acute distress  Eyes:  Pupils are equal, round, and reactive to light and accomodation.  Extraocular movement is intact.  Conjunctivae are not inflamed.  Ears:  Canals normal.  Tympanic membranes normal.   Nose:  Mildly congested turbinates.  No sinus tenderness  Pharynx:  Normal  Neck:  Supple.  Tender shotty posterior nodes are palpated bilaterally.  Lungs:  Clear to auscultation.  Breath sounds are equal.  Heart:  Regular rate and rhythm without murmurs, rubs, or gallops.  Abdomen:  Nontender without masses or  hepatosplenomegaly.  Bowel sounds are present.  No CVA or flank tenderness.  Skin:  No rash Assessment New Problems: UPPER RESPIRATORY INFECTION, ACUTE (ICD-465.9)  NO EVIDENCE BACTERIAL INFECTION TODAY  Plan New Medications/Changes: AZITHROMYCIN 250 MG TABS (AZITHROMYCIN) Two tabs by mouth on day 1, then 1 tab daily on days 2 through 5 (Rx void after 01/02/11)  #6 tabs x 0, 12/25/2010, Donna Christen MD  New Orders: Pulse Oximetry (single measurment) [94760] Est. Patient Level III [[address]] Planning Comments:   Treat symptomatically for now:  Increase fluid intake, begin expectorant/decongestant, topical decongestant,  cough suppressant at bedtime.  If fever/chills/sweats persist, or if not improving 5  days begin Z-pack (given Rx to hold).  Followup with PCP if not improving 7 to 10 days.   The patient and/or caregiver has been counseled thoroughly with regard to medications prescribed including dosage, schedule, interactions, rationale for use, and possible side effects and they verbalize understanding.  Diagnoses and expected course of recovery discussed and will return if not improved as expected or if the condition worsens. Patient and/or caregiver verbalized understanding.  Prescriptions: AZITHROMYCIN 250 MG TABS (AZITHROMYCIN) Two tabs by mouth on day 1, then 1 tab daily on days 2 through 5 (Rx void after 01/02/11)  #6 tabs x 0   Entered and Authorized by:   Donna Christen MD   Signed by:   Donna Christen MD on 12/25/2010   Method used:   Print then Give to Patient   RxID:   [id number]   Patient Instructions: 1)  Take Mucinex D (guaifenesin with decongestant) twice daily for congestion. 2)  Increase fluid intake, rest. 3)  May use Afrin nasal spray (or generic oxymetazoline) twice daily for about 5 days.  Also recommend using saline nasal spray several times daily and/or saline nasal irrigation. 4)  May continue cough suppressant at bedtime. 5)  Begin Azithromycin if not  improving about 5 days or if persistent fever develops. 6)  Followup with family doctor if not improving 7 to 10 days.   Orders Added: 1)  Pulse Oximetry (single measurment) [94760] 2)  Est. Patient Level III [95621]

## 2011-03-18 NOTE — Telephone Encounter (Signed)
  Phone Note Outgoing Call   Call placed by: Lavell Islam RN,  December 27, 2010 5:15 PM Call placed to: Patient Action Taken: Phone Call Completed Summary of Call: Message left on voice mail inquiring about patient's status; encouraged her to call with any questions/concerns. Initial call taken by: Lavell Islam RN,  December 27, 2010 5:15 PM

## 2011-03-18 NOTE — Letter (Signed)
Summary: Out of Work  [facility]  [address]   [place], [place] [address]   Phone: [phone]  Fax: [phone]    Sep 05, 2010   Employee:  KATRICIA PREHN    To Whom It May Concern:   For Medical reasons, please excuse the above named employee from work today and tomorrow.   If you need additional information, please feel free to contact our office.         Sincerely,    Donna Christen MD

## 2011-03-18 NOTE — Telephone Encounter (Signed)
  Phone Note Call from Patient Call back at Home Phone [phone] [facility]     Caller: Patient Reason for Call: Talk to Nurse, Lab or Test Results Summary of Call: Patient called wanting CT results. Negative CT, urine culture, and BMP result given. Patient is still experiencing the same pain today. She would like to talk with Dr. Cathren Harsh further about other possibilities of what is causing her pain. Initial call taken by: Lajean Saver RN,  November 06, 2010 2:28 PM    Attempted to contact patient; no answer. Donna Christen MD  November 07, 2010 9:27 AM   Discussed lab reports and negative CT results with patient.  She reports that today she has developed loose stools but has no diarrhea or vomiting.  No fever.  She still has discomfort in her left hip and low back area, and prefers to sleep on her right side at night.  Assessment and plan:   New onset diarrhea may represent a viral syndrome.   Recommend clear liquids today then slowly advance diet. Recommend gyn exam by her gynecologist or PCP. Suspect left hip and back pain may be musculoskeletal in origin.  Recommend return for further evaluation, or consider sports med clinic. Donna Christen MD  November 07, 2010 11:10 AM

## 2011-03-18 NOTE — Progress Notes (Signed)
Summary: SORE THROATrm 4   Vital Signs:  Patient Profile:   25 Years Old Female CC:      Cold & URI symptoms x 2 days Height:     65 inches Weight:      192.50 pounds O2 Sat:      100 % O2 treatment:    Room Air Temp:     98.7 degrees F oral Pulse rate:   72 / minute Resp:     18 per minute BP sitting:   138 / 83  (left arm) Cuff size:   large  Vitals Entered By: Clemens Catholic LPN (Sep 05, 2010 11:23 AM)                  Updated Prior Medication List: ALPRAZOLAM 0.25 MG TABS (ALPRAZOLAM) as needed VALACYCLOVIR HCL 500 MG TABS (VALACYCLOVIR HCL) AS needed PROAIR HFA 108 (90 BASE) MCG/ACT AERS (ALBUTEROL SULFATE) PRN  Current Allergies (reviewed today): ! SULFA ! * LATEX ! * ALOE ! * CATSHistory of Present Illness Chief Complaint: Cold & URI symptoms x 2 days History of Present Illness:  Subjective: Patient complains of fatigue that started 3 days ago, followed by hoarseness yesterday.  Past history of pneumonia as a child + sore throat today + cough yesterday No pleuritic pain, but complains of soreness in the anterior chest ? wheezing; she use her albuterol inhaler today but it is out of date No nasal congestion No post-nasal drainage No sinus pain/pressure No itchy/red eyes No earache No hemoptysis ? SOB No fever/chills, but has had sweats No nausea No vomiting No abdominal pain No diarrhea No skin rashes + myalgias No headache Used OTC meds without relief   REVIEW OF SYSTEMS Constitutional Symptoms      Denies fever, chills, night sweats, weight loss, weight gain, and fatigue.  Eyes       Denies change in vision, eye pain, eye discharge, glasses, contact lenses, and eye surgery. Ear/Nose/Throat/Mouth       Complains of sinus problems and hoarseness.      Denies hearing loss/aids, change in hearing, ear pain, ear discharge, dizziness, frequent runny nose, frequent nose bleeds, sore throat, and tooth pain or bleeding.  Respiratory       Complains  of dry cough and wheezing.      Denies productive cough, shortness of breath, asthma, bronchitis, and emphysema/COPD.  Cardiovascular       Denies murmurs, chest pain, and tires easily with exhertion.    Gastrointestinal       Denies stomach pain, nausea/vomiting, diarrhea, constipation, blood in bowel movements, and indigestion. Genitourniary       Denies painful urination, kidney stones, and loss of urinary control. Neurological       Denies paralysis, seizures, and fainting/blackouts. Musculoskeletal       Denies muscle pain, joint pain, joint stiffness, decreased range of motion, redness, swelling, muscle weakness, and gout.  Skin       Denies bruising, unusual mles/lumps or sores, and hair/skin or nail changes.  Psych       Denies mood changes, temper/anger issues, anxiety/stress, speech problems, depression, and sleep problems. Other Comments: pt c/o fatigue, hoarseness, mostly a dry cough with a little yellow/brown phelgm in the morning x 2days. she has taken Dimeatapp and Mucinex. she used her Proair inhaler this morning.   Past History:  Past Medical History: Reviewed history from 02/11/2009 and no changes required. Allergic rhinitis Cold Sores Anxiety Hypertension  Past Surgical History: Reviewed history  from 02/11/2009 and no changes required. Oral Surgery  Family History: Reviewed history from 02/11/2009 and no changes required.   Family History Hypertension Family History Diabetes 1st degree relative  Social History: Reviewed history from 02/11/2009 and no changes required. Single Never Smoked Alcohol use-yes 1 drink weekly Drug use-no Regular exercise-yes   Objective:  Appearance:  Patient appears healthy, stated age, and in no acute distress.  She is hoarse  Eyes:  Pupils are equal, round, and reactive to light and accomodation.  Extraocular movement is intact.  Conjunctivae are not inflamed.  Ears:  Canals normal.  Tympanic membranes normal.   Nose:   Mildly congested turbinates.  No sinus tenderness  Pharynx:  Mildly erythematous Neck:  Supple.  Slightly tender shotty anterior/posterior nodes are palpated bilaterally.  Lungs:  Clear to auscultation.  Breath sounds are equal.  Chest:  Distinct tenderness over sternum Heart:  Regular rate and rhythm without murmurs, rubs, or gallops.  Abdomen:  Nontender without masses or hepatosplenomegaly.  Bowel sounds are present.  No CVA or flank tenderness.  Extremities:  No edema.  Skin:  No rash Rapid strep test negative  Assessment New Problems: COSTOCHONDRITIS, ACUTE (ICD-733.6) UPPER RESPIRATORY INFECTION, ACUTE (ICD-465.9) ACUTE PHARYNGITIS (ICD-462)  NO EVIDENCE BACTERIAL INFECTION TODAY  Plan New Medications/Changes: AZITHROMYCIN 250 MG TABS (AZITHROMYCIN) Two tabs by mouth on day 1, then 1 tab daily on days 2 through 5 (Rx void after 09/12/10)  #6 tabs x 0, 09/05/2010, Donna Christen MD PREDNISONE 10 MG TABS (PREDNISONE) 2 PO BID for 2 days, then 1 BID for 2 days, then 1 daily for 2 days.  Take PC  #14 x 0, 09/05/2010, Donna Christen MD CHERATUSSIN AC 100-10 MG/5ML SYRP (GUAIFENESIN-CODEINE) 5 to 10cc by mouth hs as needed cough  #4oz x 0, 09/05/2010, Donna Christen MD BENZONATATE 200 MG CAPS (BENZONATATE) One by mouth hs as needed cough  #12 x 0, 09/05/2010, Donna Christen MD PROAIR HFA 108 (90 BASE) MCG/ACT AERS (ALBUTEROL SULFATE) Two inhalations q4-6hr as needed.  Max 12 puffs/day  #1 MDI x 0, 09/05/2010, Donna Christen MD  New Orders: T-Culture, Throat [[id number]] Rapid Strep [09811] Pulse Oximetry (single measurment) [94760] Est. Patient Level III [91478] Planning Comments:   Treat symptomatically for now:  Increase fluid intake, begin expectorant, topical decongestant, tapering course of prednisone, ProAir inhaler,  cough suppressant at bedtime.  If fever/chills/sweats persist, or if not improving 5  days begin Z-pack (given Rx to hold).  Followup with PCP if not improving 7 to 10  days.   The patient and/or caregiver has been counseled thoroughly with regard to medications prescribed including dosage, schedule, interactions, rationale for use, and possible side effects and they verbalize understanding.  Diagnoses and expected course of recovery discussed and will return if not improved as expected or if the condition worsens. Patient and/or caregiver verbalized understanding.  Prescriptions: AZITHROMYCIN 250 MG TABS (AZITHROMYCIN) Two tabs by mouth on day 1, then 1 tab daily on days 2 through 5 (Rx void after 09/12/10)  #6 tabs x 0   Entered and Authorized by:   Donna Christen MD   Signed by:   Donna Christen MD on 09/05/2010   Method used:   Print then Give to Patient   RxID:   [id number] PREDNISONE 10 MG TABS (PREDNISONE) 2 PO BID for 2 days, then 1 BID for 2 days, then 1 daily for 2 days.  Take PC  #14 x 0   Entered and Authorized by:   Jeannett Senior  Minnah Llamas MD   Signed by:   Donna Christen MD on 09/05/2010   Method used:   Print then Give to Patient   RxID:   [id number] CHERATUSSIN AC 100-10 MG/5ML SYRP (GUAIFENESIN-CODEINE) 5 to 10cc by mouth hs as needed cough  #4oz x 0   Entered and Authorized by:   Donna Christen MD   Signed by:   Donna Christen MD on 09/05/2010   Method used:   Print then Give to Patient   RxID:   [id number] BENZONATATE 200 MG CAPS (BENZONATATE) One by mouth hs as needed cough  #12 x 0   Entered and Authorized by:   Donna Christen MD   Signed by:   Donna Christen MD on 09/05/2010   Method used:   Print then Give to Patient   RxID:   [id number] PROAIR HFA 108 (90 BASE) MCG/ACT AERS (ALBUTEROL SULFATE) Two inhalations q4-6hr as needed.  Max 12 puffs/day  #1 MDI x 0   Entered and Authorized by:   Donna Christen MD   Signed by:   Donna Christen MD on 09/05/2010   Method used:   Print then Give to Patient   RxID:   [id number]   Patient Instructions: 1)  Take Mucinex  (guaifenesin ) twice daily for congestion. 2)  Increase  fluid intake, rest. 3)  Continue ProAir inhaler as needed 4)  May use Afrin nasal spray (or generic oxymetazoline) twice daily for about 5 days.  Also recommend using saline nasal spray several times daily and/or saline nasal irrigation. 5)  Begin Azithromycin if not improving about 5 days or if persistent fever develops. 6)  Followup with family doctor if not improving 7 to 10 days.   Orders Added: 1)  T-Culture, Throat [[id number]] 2)  Rapid Strep [64332] 3)  Pulse Oximetry (single measurment) [94760] 4)  Est. Patient Level III [95188]    Laboratory Results  Date/Time Received: Sep 05, 2010 12:01 PM Date/Time Reported: Sep 05, 2010 12:01 PM   Other Tests  Rapid Strep: negative  Kit Test Internal QC: Negative   (Normal Range: Negative)

## 2011-03-18 NOTE — Progress Notes (Signed)
Summary: left flank pain intermittant x 4 weeks (room 4)   Vital Signs:  Patient Profile:   25 Years Old Female CC:      left flank pain intermittant x 4 weeks Height:     65 inches Weight:      197.50 pounds O2 Sat:      977 % O2 treatment:    Room Air Temp:     98.4 degrees F oral Pulse rate:   71 / minute Resp:     16 per minute BP sitting:   125 / 83  (left arm) Cuff size:   regular  Pt. in pain?   yes    Location:   left flank  Vitals Entered By: Lavell Islam RN (November 04, 2010 1:46 PM)                   Current Allergies: ! SULFA ! * LATEX ! * ALOE ! * CATSHistory of Present Illness Chief Complaint: left flank pain intermittant x 4 weeks History of Present Illness:  Subjective:  Patient complains of 3 day history of urinary frequency/urgency and left flank/lower back pain.  No hesitancy.  Mild nausea without vomiting.  No fevers but feels hot at night.  No hematuria.  No vaginal discharge or pelvic pain.  Last menstrual period June 22-26, normal. On July 1 she had hematuria and mild dysuria for one day. On July 13 and 14 she had recurrent dysuria and urgency that resolved spontaneously.  No hematuria. Family history of nephrolithiasis in uncle and father.  REVIEW OF SYSTEMS Constitutional Symptoms      Denies fever, chills, night sweats, weight loss, weight gain, and fatigue.  Eyes       Denies change in vision, eye pain, eye discharge, glasses, contact lenses, and eye surgery. Ear/Nose/Throat/Mouth       Denies hearing loss/aids, change in hearing, ear pain, ear discharge, dizziness, frequent runny nose, frequent nose bleeds, sinus problems, sore throat, hoarseness, and tooth pain or bleeding.  Respiratory       Denies dry cough, productive cough, wheezing, shortness of breath, asthma, bronchitis, and emphysema/COPD.  Cardiovascular       Denies murmurs, chest pain, and tires easily with exhertion.    Gastrointestinal       Complains of nausea/vomiting.       Denies stomach pain, diarrhea, constipation, blood in bowel movements, and indigestion. Genitourniary       Denies painful urination, kidney stones, and loss of urinary control.      Comments: left flank pain Neurological       Denies paralysis, seizures, and fainting/blackouts. Musculoskeletal       Denies muscle pain, joint pain, joint stiffness, decreased range of motion, redness, swelling, muscle weakness, and gout.  Skin       Denies bruising, unusual mles/lumps or sores, and hair/skin or nail changes.  Psych       Denies mood changes, temper/anger issues, anxiety/stress, speech problems, depression, and sleep problems. Other Comments: left flank pain intermittant x 4 weeks   Past History:  Family History: Last updated: 02/11/2009   Family History Hypertension Family History Diabetes 1st degree relative  Social History: Last updated: 02/11/2009 Single Never Smoked Alcohol use-yes 1 drink weekly Drug use-no Regular exercise-yes  Past Medical History: Reviewed history from 02/11/2009 and no changes required. Allergic rhinitis Cold Sores Anxiety Hypertension  Past Surgical History: Reviewed history from 02/11/2009 and no changes required. Oral Surgery  Family History: Reviewed history from  02/11/2009 and no changes required.   Family History Hypertension Family History Diabetes 1st degree relative  Social History: Reviewed history from 02/11/2009 and no changes required. Single Never Smoked Alcohol use-yes 1 drink weekly Drug use-no Regular exercise-yes   Objective:  Appearance:  Patient is obese but otherwise appears healthy, stated age, and in no acute distress  Eyes:  Pupils are equal, round, and reactive to light and accomodation.  Extraocular movement is intact.  Conjunctivae are not inflamed.  Mouth/pharynx:  moist mucous membranes  Neck:  Supple.  No adenopathy is present.  Lungs:  Clear to auscultation.  Breath sounds are equal.  Heart:   Regular rate and rhythm without murmurs, rubs, or gallops.  Abdomen:  Tenderness over spleen without masses or hepatosplenomegaly.  No lower abdominal tenderness. Bowel sounds are present.  + left CVA and flank tenderness.  Extremities:  No edema.   Skin:  No rash urinalysis (dipstick): negative CBC:  WBC 10.8 ; LY 33.7, MO 6.2, GR 60.1; Hgb 13.6 Urine pregnancy test:  negative  Assessment New Problems: HEMATURIA, HX OF (ICD-V13.09) FLANK PAIN, LEFT (ICD-789.09) LUQ PAIN (ICD-789.02)  DESPITE NEGATIVE URINALYSIS TODAY, SUSPECT NEPHROLITHIASIS  Plan New Orders: Urinalysis [CPT-81003] CBC w/Diff [[id number]] T-Culture, Urine [[id number]] Urine Pregnancy [CPT-81025] T-Basic Metabolic Panel [[id number]] T-CT Abdomen/pelvis w/o [74176] Services provided After hours-Weekends-Holidays [99051] Est. Patient Level V [52841] Planning Comments:   Urine culture pending.  BMP to evaluate renal function. Increase fluid intake.  May continue Ibuprofen 200mg , 4 tabs every 8 hours with food. Schedule CT scan abdomen/pelvis without contrast tomorrow.  If stone is present, may benefit from Flomax. If symptoms become significantly worse during the night or over the weekend, proceed to the local emergency room.   The patient and/or caregiver has been counseled thoroughly with regard to medications prescribed including dosage, schedule, interactions, rationale for use, and possible side effects and they verbalize understanding.  Diagnoses and expected course of recovery discussed and will return if not improved as expected or if the condition worsens. Patient and/or caregiver verbalized understanding.   Orders Added: 1)  Urinalysis [CPT-81003] 2)  CBC w/Diff [[id number]] 3)  T-Culture, Urine [[id number]] 4)  Urine Pregnancy [CPT-81025] 5)  T-Basic Metabolic Panel [[id number]] 6)  T-CT Abdomen/pelvis w/o [74176] 7)  Services provided After hours-Weekends-Holidays [99051] 8)  Est. Patient  Level V [64332]    Laboratory Results   Urine Tests  Date/Time Received: November 04, 2010 1:55 PM  Date/Time Reported: November 04, 2010 1:55 PM   Routine Urinalysis   Color: yellow Appearance: Hazy Glucose: negative   (Normal Range: Negative) Bilirubin: negative   (Normal Range: Negative) Ketone: negative   (Normal Range: Negative) Spec. Gravity: >=1.030   (Normal Range: 1.003-1.035) Blood: negative   (Normal Range: Negative) pH: 5.5   (Normal Range: 5.0-8.0) Protein: negative   (Normal Range: Negative) Urobilinogen: 0.2   (Normal Range: 0-1) Nitrite: negative   (Normal Range: Negative) Leukocyte Esterace: negative   (Normal Range: Negative)    Urine HCG: negative

## 2011-03-18 NOTE — Telephone Encounter (Signed)
  Phone Note Outgoing Call Call back at Home Phone [phone] [facility]     Call placed by: Lajean Saver RN,  Sep 07, 2010 12:02 PM Call placed to: Patient Summary of Call: Callback: No answer. Message left on patient's personal cell to call with questions or concerns and throat culture negative.

## 2011-03-18 NOTE — Letter (Signed)
Summary: Out of Work  [facility]  [address]   [place], [place] [address]   Phone: [phone]  Fax: [phone]    December 25, 2010   Employee:  Rebecca Booth    To Whom It May Concern:   For Medical reasons, please excuse the above named employee from work today and tomorrow.  If you need additional information, please feel free to contact our office.         Sincerely,    Donna Christen MD

## 2011-06-01 ENCOUNTER — Emergency Department
Admission: EM | Admit: 2011-06-01 | Discharge: 2011-06-01 | Disposition: A | Discharge status: Home / Self Care | Payer: BC Managed Care – PPO | Source: Home / Self Care | Attending: Family Medicine | Admitting: Family Medicine

## 2011-06-01 ENCOUNTER — Encounter: Payer: Self-pay | Admitting: Emergency Medicine

## 2011-06-01 DIAGNOSIS — J069 Acute upper respiratory infection, unspecified: Secondary | ICD-10-CM

## 2011-06-01 DIAGNOSIS — J029 Acute pharyngitis, unspecified: Secondary | ICD-10-CM

## 2011-06-01 HISTORY — DX: Essential (primary) hypertension: I10

## 2011-06-01 MED ORDER — AZITHROMYCIN 250 MG PO TABS: ORAL_TABLET | ORAL | Status: AC

## 2011-06-01 NOTE — Discharge Instructions (Signed)
Take Mucinex D (guaifenesin with decongestant) twice daily for congestion.  Increase fluid intake, rest. May use Afrin nasal spray (or generic oxymetazoline) twice daily for about 5 days.  Also recommend using saline nasal spray several times daily and saline nasal irrigation (AYR is a common brand) Stop all antihistamines for now, and other non-prescription cough/cold preparations. May take Ibuprofen 200mg , 4 tabs every 8 hours with food for chest/sternum discomfort. Begin Azithromycin if throat culture positive, if not improving about 5 days or if persistent fever develops. Follow-up with family doctor if not improving 7 to 10 days.

## 2011-06-01 NOTE — ED Provider Notes (Signed)
History     CSN: [id number]  Arrival date & time 06/01/11  1118   First MD Initiated Contact with Patient 06/01/11 1318      Chief Complaint  Patient presents with  . Otalgia     HPI Comments: Patient complains of onset of right sided sore throat and neck yesterday, with pain radiating to the right ear.  She has had some post nasal drainage, and brief right nosebleed yesterday.  No cough.  No fevers, chills, and sweats   The history is provided by the patient.    Past Medical History  Diagnosis Date  . Hypertension     History reviewed. No pertinent past surgical history.  Family History  Problem Relation Age of Onset  . Hyperlipidemia Father   . Diabetes Father     History  Substance Use Topics  . Smoking status: Never Smoker   . Smokeless tobacco: Not on file  . Alcohol Use: Yes    OB History    Grav Para Term Preterm Abortions TAB SAB Ect Mult Living                  Review of Systems + right sided sore throat No cough No pleuritic pain No wheezing + mild nasal congestion ? post-nasal drainage No sinus pain/pressure No itchy/red eyes + right earache No hemoptysis No SOB No fever/chills No nausea No vomiting No abdominal pain No diarrhea No urinary symptoms No skin rashes + fatigue No myalgias No headache   Allergies  Latex and Sulfonamide derivatives  Home Medications   Current Outpatient Rx  Name Route Sig Dispense Refill  . MEDROXYPROGESTERONE ACETATE 150 MG/ML IM SUSP Intramuscular Inject 150 mg into the muscle every 3 (three) months.    . AZITHROMYCIN 250 MG PO TABS  Take 2 tabs today; then begin one tab once daily for 4 more days (Rx void after 06/09/11) 6 each 0    BP 139/90  Pulse 94  Temp(Src) 98.4 F (36.9 C) (Oral)  Resp 16  Ht 5\' 5"  (1.651 m)  Wt 199 lb (90.266 kg)  BMI 33.12 kg/m2  SpO2 98%  Physical Exam Nursing notes and Vital Signs reviewed. Appearance:  Patient appears healthy, stated age, and in no acute  distress Eyes:  Pupils are equal, round, and reactive to light and accomodation.  Extraocular movement is intact.  Conjunctivae are not inflamed  Ears:  Canals normal.  Tympanic membranes normal.  Nose:  Mildly congested turbinates, worse on right.  No sinus tenderness.   Pharynx:  Normal Neck:  Supple.  Slightly tender shotty posterior nodes are palpated bilaterally  Lungs:  Clear to auscultation.  Breath sounds are equal.  Heart:  Regular rate and rhythm without murmurs, rubs, or gallops.  Abdomen:  Nontender without masses or hepatosplenomegaly.  Bowel sounds are present.  No CVA or flank tenderness.  Extremities:  No edema.  No calf tenderness Skin:  No rash present.   ED Course  Procedures none   Labs Reviewed  POCT RAPID STREP A (OFFICE) negative      1. Acute pharyngitis   2. Acute upper respiratory infections of unspecified site       MDM   There is no evidence of bacterial infection today.   Throat culture pending Treat symptomatically for now:  Take Mucinex D (guaifenesin with decongestant) twice daily for congestion.  Increase fluid intake, rest. May use Afrin nasal spray (or generic oxymetazoline) twice daily for about 5 days.  Also recommend  using saline nasal spray several times daily and saline nasal irrigation (AYR is a common brand) Stop all antihistamines for now, and other non-prescription cough/cold preparations. May take Ibuprofen 200mg , 4 tabs every 8 hours with food for chest/sternum discomfort. Begin Azithromycin if throat culture positive, if not improving about 5 days or if persistent fever develops (Given a prescription to hold, with an expiration date)  Follow-up with family doctor if not improving 7 to 10 days.         Donna Christen, MD 06/01/11 [phone]

## 2011-06-01 NOTE — ED Notes (Signed)
Right ear pain and sore throat x 24 hours.

## 2011-06-02 LAB — STREP A DNA PROBE: GASP: NEGATIVE

## 2013-06-02 ENCOUNTER — Encounter: Payer: Self-pay | Admitting: Emergency Medicine

## 2013-06-02 ENCOUNTER — Emergency Department
Admission: EM | Admit: 2013-06-02 | Discharge: 2013-06-02 | Disposition: A | Discharge status: Home / Self Care | Payer: BC Managed Care – PPO | Source: Home / Self Care | Attending: Family Medicine | Admitting: Family Medicine

## 2013-06-02 DIAGNOSIS — J069 Acute upper respiratory infection, unspecified: Secondary | ICD-10-CM

## 2013-06-02 MED ORDER — AMOXICILLIN 875 MG PO TABS: 875.0000 mg | ORAL_TABLET | Freq: Two times a day (BID) | ORAL | Status: DC

## 2013-06-02 MED ORDER — TRIAMCINOLONE ACETONIDE 40 MG/ML IJ SUSP
40.0000 mg | Freq: Once | INTRAMUSCULAR | Status: AC
  Administered 2013-06-02: 40 mg via INTRAMUSCULAR

## 2013-06-02 MED ORDER — BENZONATATE 200 MG PO CAPS: 200.0000 mg | ORAL_CAPSULE | Freq: Every day | ORAL | Status: DC

## 2013-06-02 NOTE — ED Notes (Signed)
Ears hurts, sinus pain and pressure, pressure behind eyes, hoarseness x 4 days

## 2013-06-02 NOTE — Discharge Instructions (Signed)
Take plain Mucinex (1200 mg guaifenesin) twice daily for cough and congestion.  Increase fluid intake, rest. °May use Afrin nasal spray (or generic oxymetazoline) twice daily for about 5 days.  Also recommend using saline nasal spray several times daily and saline nasal irrigation (AYR is a common brand) °Try warm salt water gargles for sore throat.  °Stop all antihistamines for now, and other non-prescription cough/cold preparations. °Begin Amoxicillin if not improving about 5 days or if persistent fever develops   °Follow-up with family doctor if not improving 7 to 10 days.  °

## 2013-06-02 NOTE — ED Provider Notes (Signed)
CSN: [id number]     Arrival date & time 06/02/13  1530 History   First MD Initiated Contact with Patient 06/02/13 1727     Chief Complaint  Patient presents with  . URI        HPI Comments: Patient developed fatigue, sneezing and increased nasal congestion three days ago.  She has been hoarse, and developed a mild sore throat and cough two days ago.  Today she has had mild bilateral earache, and her anterior chest feels tight. She notes that her cough tends to linger when she gets a viral URI.  She may have had mild exercise asthma in the past, and she has a history of asthma in her father and paternal uncle.  The history is provided by the patient.    Past Medical History  Diagnosis Date  . Hypertension    History reviewed. No pertinent past surgical history. Family History  Problem Relation Age of Onset  . Hyperlipidemia Father   . Diabetes Father    History  Substance Use Topics  . Smoking status: Never Smoker   . Smokeless tobacco: Not on file  . Alcohol Use: Yes   OB History   Grav Para Term Preterm Abortions TAB SAB Ect Mult Living                 Review of Systems + sore throat + cough + hoarse No pleuritic pain, but has tightness in anterior chest No wheezing + nasal congestion + post-nasal drainage No sinus pain/pressure, but has pressure behind eyes No itchy/red eyes ? earache No hemoptysis No SOB No fever/chills No nausea No vomiting No abdominal pain No diarrhea No urinary symptoms No skin rash + fatigue No myalgias No headache Used OTC meds without relief    Allergies  Latex and Sulfonamide derivatives  Home Medications   Current Outpatient Rx  Name  Route  Sig  Dispense  Refill  . belladona alk-PHENObarbital (DONNATAL) 16.2 MG tablet   Oral   Take 1 tablet by mouth as needed.         . cetirizine (ZYRTEC) 10 MG tablet   Oral   Take 10 mg by mouth daily.         . citalopram (CELEXA) 10 MG tablet   Oral   Take 10 mg by  mouth daily.         . hydrochlorothiazide (HYDRODIURIL) 25 MG tablet   Oral   Take 25 mg by mouth daily.         Marland Kitchen lisinopril (PRINIVIL,ZESTRIL) 10 MG tablet   Oral   Take 10 mg by mouth daily.         Marland Kitchen amoxicillin (AMOXIL) 875 MG tablet   Oral   Take 1 tablet (875 mg total) by mouth 2 (two) times daily. (Rx void after 06/10/13)   20 tablet   0   . benzonatate (TESSALON) 200 MG capsule   Oral   Take 1 capsule (200 mg total) by mouth at bedtime. Take as needed for cough   12 capsule   0   . medroxyPROGESTERone (DEPO-PROVERA) 150 MG/ML injection   Intramuscular   Inject 150 mg into the muscle every 3 (three) months.          BP 126/80  Pulse 101  Temp(Src) 98.3 F (36.8 C) (Oral)  Ht '5\' 5"'  (1.651 m)  Wt 212 lb (96.163 kg)  BMI 35.28 kg/m2  SpO2 97% Physical Exam Nursing notes and Vital Signs reviewed. Appearance:  Patient appears stated age, and in no acute distress.  Patient is obese (BMI 35.3) Eyes:  Pupils are equal, round, and reactive to light and accomodation.  Extraocular movement is intact.  Conjunctivae are not inflamed  Ears:  Canals normal.  Tympanic membranes normal.  Nose:  Mildly congested turbinates.  No sinus tenderness.    Pharynx:  Normal Neck:  Supple.   Tender shotty posterior nodes are palpated bilaterally  Lungs:  Clear to auscultation.  Breath sounds are equal.  Heart:  Regular rate and rhythm without murmurs, rubs, or gallops.  Abdomen:  Nontender without masses or hepatosplenomegaly.  Bowel sounds are present.  No CVA or flank tenderness.  Extremities:  No edema.  No calf tenderness Skin:  No rash present.   ED Course  Procedures  none       MDM   Final diagnoses:  Acute upper respiratory infections of unspecified site; early viral URI   Kenalog 9m IM There is no evidence of bacterial infection today.  Begin prednisone burst.  Prescription written for Benzonatate (Tessalon) to take at bedtime for night-time cough.  Take  plain Mucinex (1200 mg guaifenesin) twice daily for cough and congestion.  Increase fluid intake, rest. May use Afrin nasal spray (or generic oxymetazoline) twice daily for about 5 days.  Also recommend using saline nasal spray several times daily and saline nasal irrigation (AYR is a common brand) Try warm salt water gargles for sore throat.  Stop all antihistamines for now, and other non-prescription cough/cold preparations. Begin Amoxicillin if not improving about 5 days or if persistent fever develops (Given a prescription to hold, with an expiration date)  Follow-up with family doctor if not improving 7 to 10 days.     SKandra Nicolas MD 06/04/13 1700

## 2016-11-27 ENCOUNTER — Emergency Department
Admission: EM | Admit: 2016-11-27 | Discharge: 2016-11-27 | Disposition: A | Discharge status: Home / Self Care | Payer: BC Managed Care – PPO | Source: Home / Self Care | Attending: Family Medicine | Admitting: Family Medicine

## 2016-11-27 ENCOUNTER — Emergency Department (INDEPENDENT_AMBULATORY_CARE_PROVIDER_SITE_OTHER): Payer: BC Managed Care – PPO

## 2016-11-27 ENCOUNTER — Encounter: Payer: Self-pay | Admitting: *Deleted

## 2016-11-27 DIAGNOSIS — S60042A Contusion of left ring finger without damage to nail, initial encounter: Secondary | ICD-10-CM

## 2016-11-27 DIAGNOSIS — S6710XA Crushing injury of unspecified finger(s), initial encounter: Secondary | ICD-10-CM | POA: Diagnosis not present

## 2016-11-27 DIAGNOSIS — S60022A Contusion of left index finger without damage to nail, initial encounter: Secondary | ICD-10-CM

## 2016-11-27 DIAGNOSIS — S60032A Contusion of left middle finger without damage to nail, initial encounter: Secondary | ICD-10-CM

## 2016-11-27 DIAGNOSIS — M7989 Other specified soft tissue disorders: Secondary | ICD-10-CM

## 2016-11-27 MED ORDER — IBUPROFEN 600 MG PO TABS
600.0000 mg | ORAL_TABLET | Freq: Once | ORAL | Status: AC
  Administered 2016-11-27: 600 mg via ORAL

## 2016-11-27 NOTE — ED Provider Notes (Signed)
[facility]    CSN: [id number] Arrival date & time: 11/27/16  1150     History   Chief Complaint Chief Complaint  Patient presents with  . Finger Injury    HPI Rebecca Booth is a 31 y.o. female.   HPI  Rebecca ReichmannJenny L Winrow is a 31 y.o. female presenting to UC with c/o pain, swelling and bruising of fingers 2-4 on Left hand that occurred just PTA. Pt states she got her fingers stuck in a cabinet.  Pain is aching and throbbing, moderate in severity. Worse with movement and palpation. No pain medication taken PTA. Pt is left hand dominant. No other injuries.    Past Medical History:  Diagnosis Date  . Hypertension     Patient Active Problem List   Diagnosis Date Noted  . HEMATURIA, HX OF 11/04/2010  . CONCUSSION WITH NO LOSS OF CONSCIOUSNESS 03/05/2010  . FOOT PAIN, LEFT 12/21/2009  . ACUTE MAXILLARY SINUSITIS 11/09/2009  . ACUTE PHARYNGITIS 11/09/2009  . TMJ PAIN 11/09/2009  . ASTHMA UNSPECIFIED WITH EXACERBATION 05/22/2009  . HYPERTHYROIDISM 02/11/2009  . ANXIETY 02/11/2009  . HYPERTENSION 02/11/2009  . URI 02/11/2009  . ALLERGIC RHINITIS 02/11/2009  . COSTOCHONDRITIS, ACUTE 02/11/2009    History reviewed. No pertinent surgical history.  OB History    No data available       Home Medications    Prior to Admission medications   Medication Sig Start Date End Date Taking? Authorizing Provider  atenolol (TENORMIN) 25 MG tablet Take by mouth daily.   Yes [provider]  cetirizine (ZYRTEC) 10 MG tablet Take 10 mg by mouth daily.   Yes [provider]  hydrochlorothiazide (HYDRODIURIL) 25 MG tablet Take 25 mg by mouth daily.   Yes [provider]  medroxyPROGESTERone (DEPO-PROVERA) 150 MG/ML injection Inject 150 mg into the muscle every 3 (three) months.   Yes [provider]  sertraline (ZOLOFT) 100 MG tablet Take 100 mg by mouth daily.   Yes [provider]    Family History Family History  Problem Relation  Age of Onset  . Hyperlipidemia Father   . Diabetes Father     Social History Social History  Substance Use Topics  . Smoking status: Never Smoker  . Smokeless tobacco: Never Used  . Alcohol use Yes     Allergies   Latex and Sulfonamide derivatives   Review of Systems Review of Systems  Musculoskeletal: Positive for arthralgias, joint swelling and myalgias.  Skin: Positive for color change. Negative for wound.  Neurological: Negative for weakness and numbness.     Physical Exam Triage Vital Signs ED Triage Vitals [11/27/16 1231]  Enc Vitals Group     BP 127/79     Pulse Rate 74     Resp      Temp      Temp src      SpO2 97 %     Weight 215 lb (97.5 kg)     Height      Head Circumference      Peak Flow      Pain Score 6     Pain Loc      Pain Edu?      Excl. in GC?    No data found.   Updated Vital Signs BP 127/79 (BP Location: Left Arm)   Pulse 74   Wt 215 lb (97.5 kg)   SpO2 97%   BMI 35.78 kg/m   Visual Acuity Right Eye Distance:  Left Eye Distance:   Bilateral Distance:    Right Eye Near:   Left Eye Near:    Bilateral Near:     Physical Exam  Constitutional: She is oriented to person, place, and time. She appears well-developed and well-nourished. No distress.  HENT:  Head: Normocephalic and atraumatic.  Eyes: EOM are normal.  Neck: Normal range of motion.  Cardiovascular: Normal rate.   Pulmonary/Chest: Effort normal.  Musculoskeletal: Normal range of motion. She exhibits edema and tenderness.  Left hand: mild edema to index, middle, and ring fingers. Full ROM. Tender to dorsal aspect of middle phalanxes.   Neurological: She is alert and oriented to person, place, and time.  Skin: Skin is warm and dry. Capillary refill takes less than 2 seconds. She is not diaphoretic.  Left hand: index, middle, and ring finger: skin in tact. Faint ecchymosis.   Psychiatric: She has a normal mood and affect. Her behavior is normal.  Nursing note and  vitals reviewed.    UC Treatments / Results  Labs (all labs ordered are listed, but only abnormal results are displayed) Labs Reviewed - No data to display  EKG  EKG Interpretation None       Radiology Dg Hand Complete Left  Result Date: 11/27/2016 CLINICAL DATA:  Swelling.  Injury. EXAM: LEFT HAND - COMPLETE 3+ VIEW COMPARISON:  No recent prior. FINDINGS: Diffuse soft tissue swelling. No radiopaque foreign body. No acute bony or joint abnormality identified. No evidence of fracture or dislocation. IMPRESSION: Diffuse soft tissue swelling. No radiopaque foreign body. No acute bony abnormality. Electronically Signed   By: Maisie Fus  Register   On: 11/27/2016 12:52    Procedures Procedures (including critical care time)  Medications Ordered in UC Medications  ibuprofen (ADVIL,MOTRIN) tablet 600 mg (600 mg Oral Given 11/27/16 1246)     Initial Impression / Assessment and Plan / UC Course  I have reviewed the triage vital signs and the nursing notes.  Pertinent labs & imaging results that were available during my care of the patient were reviewed by me and considered in my medical decision making (see chart for details).     Reviewed plain films: no fracture or dislocations. Reassured pt.   Final Clinical Impressions(s) / UC Diagnoses   Final diagnoses:  Crushing injury of finger, initial encounter  Contusion of left index finger without damage to nail, initial encounter  Contusion of left middle finger without damage to nail, initial encounter  Contusion of left ring finger without damage to nail, initial encounter   Recommend conservative treatment. Acetaminophen, ibuprofen and ice.  F/u with PCP or Sports Medicine in 1-2 weeks if not improving.   New Prescriptions Discharge Medication List as of 11/27/2016 12:59 PM       Controlled Substance Prescriptions [place] Controlled Substance Registry consulted? Not Applicable   Rolla Plate 11/27/16 1534

## 2016-11-27 NOTE — ED Triage Notes (Signed)
Patient c/o catching her 2nd-4th fingers in a cabinet door this afternoon.

## 2016-11-27 NOTE — Discharge Instructions (Signed)
  You may take 500mg acetaminophen every 4-6 hours or in combination with ibuprofen 400-600mg every 6-8 hours as needed for pain and inflammation.  

## 2017-08-30 IMAGING — DX DG HAND COMPLETE 3+V*L*
3 series · 3 of 3 positions shown · non-contrast
Comparison: No recent prior.

CLINICAL DATA: Swelling.  Injury.

EXAM:
LEFT HAND - COMPLETE 3+ VIEW

[hand pa]
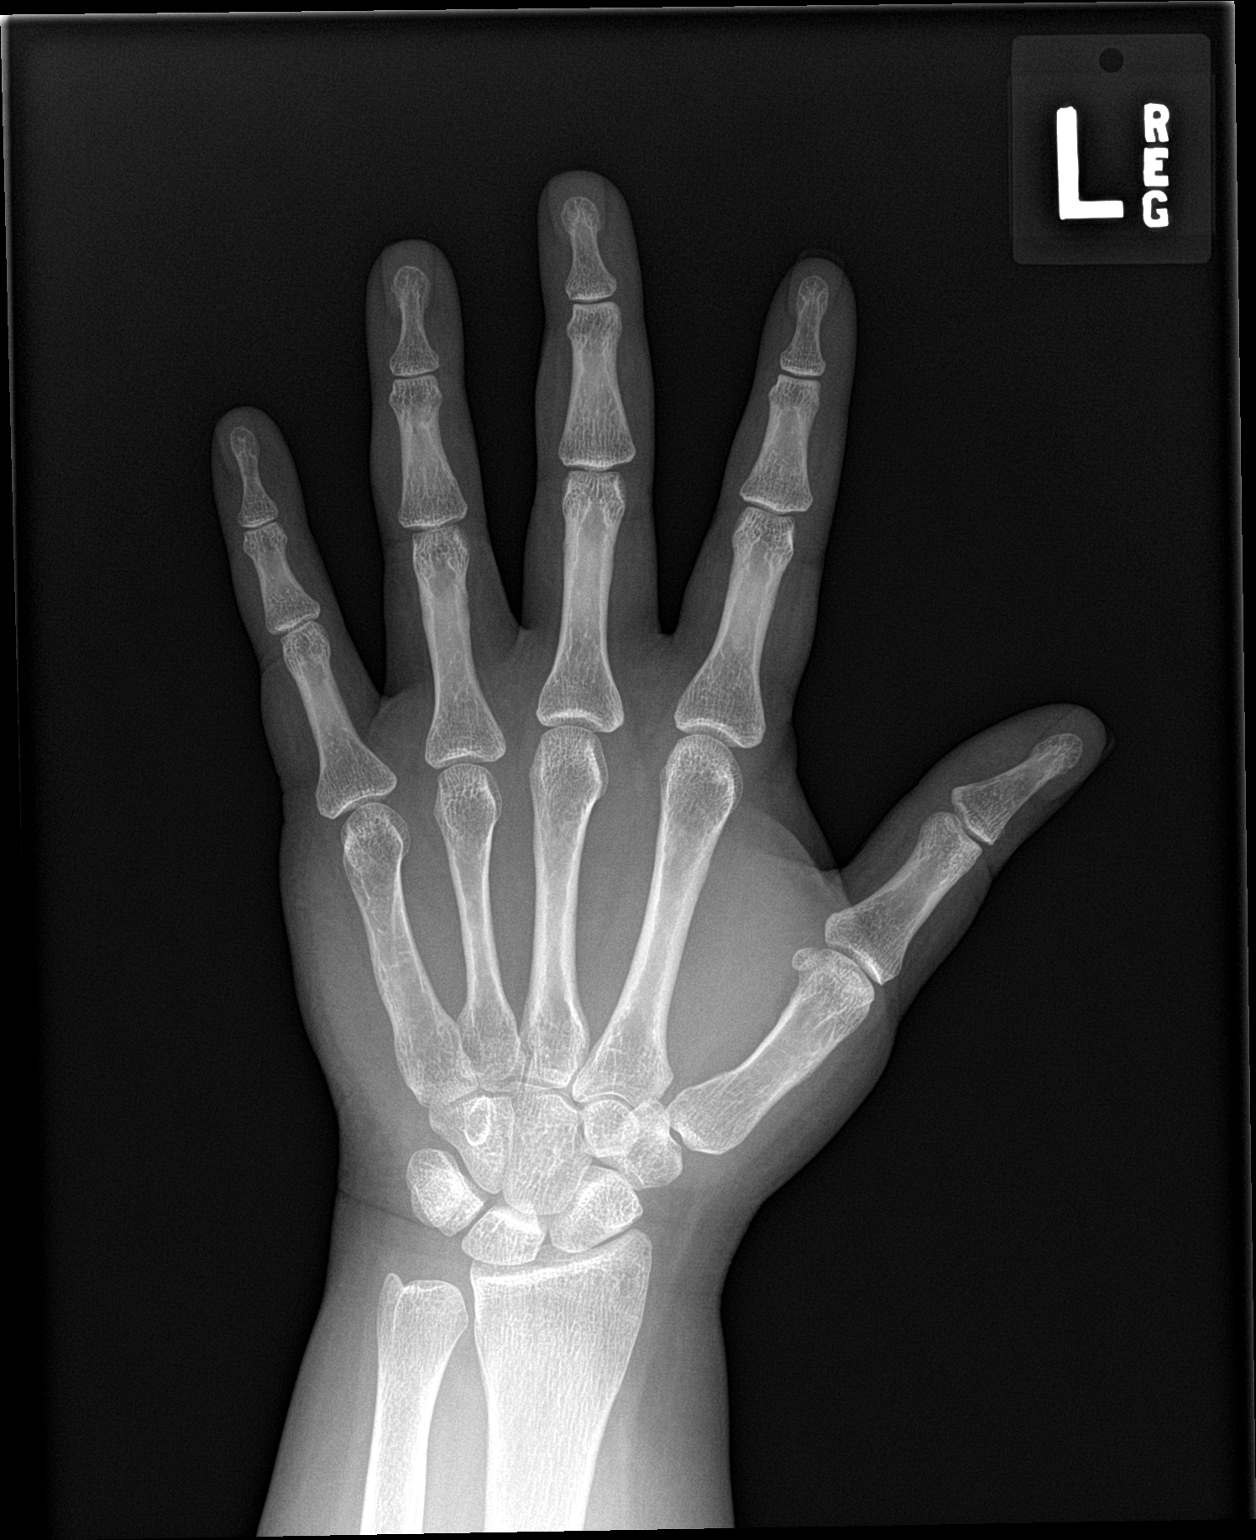

[hand obl]
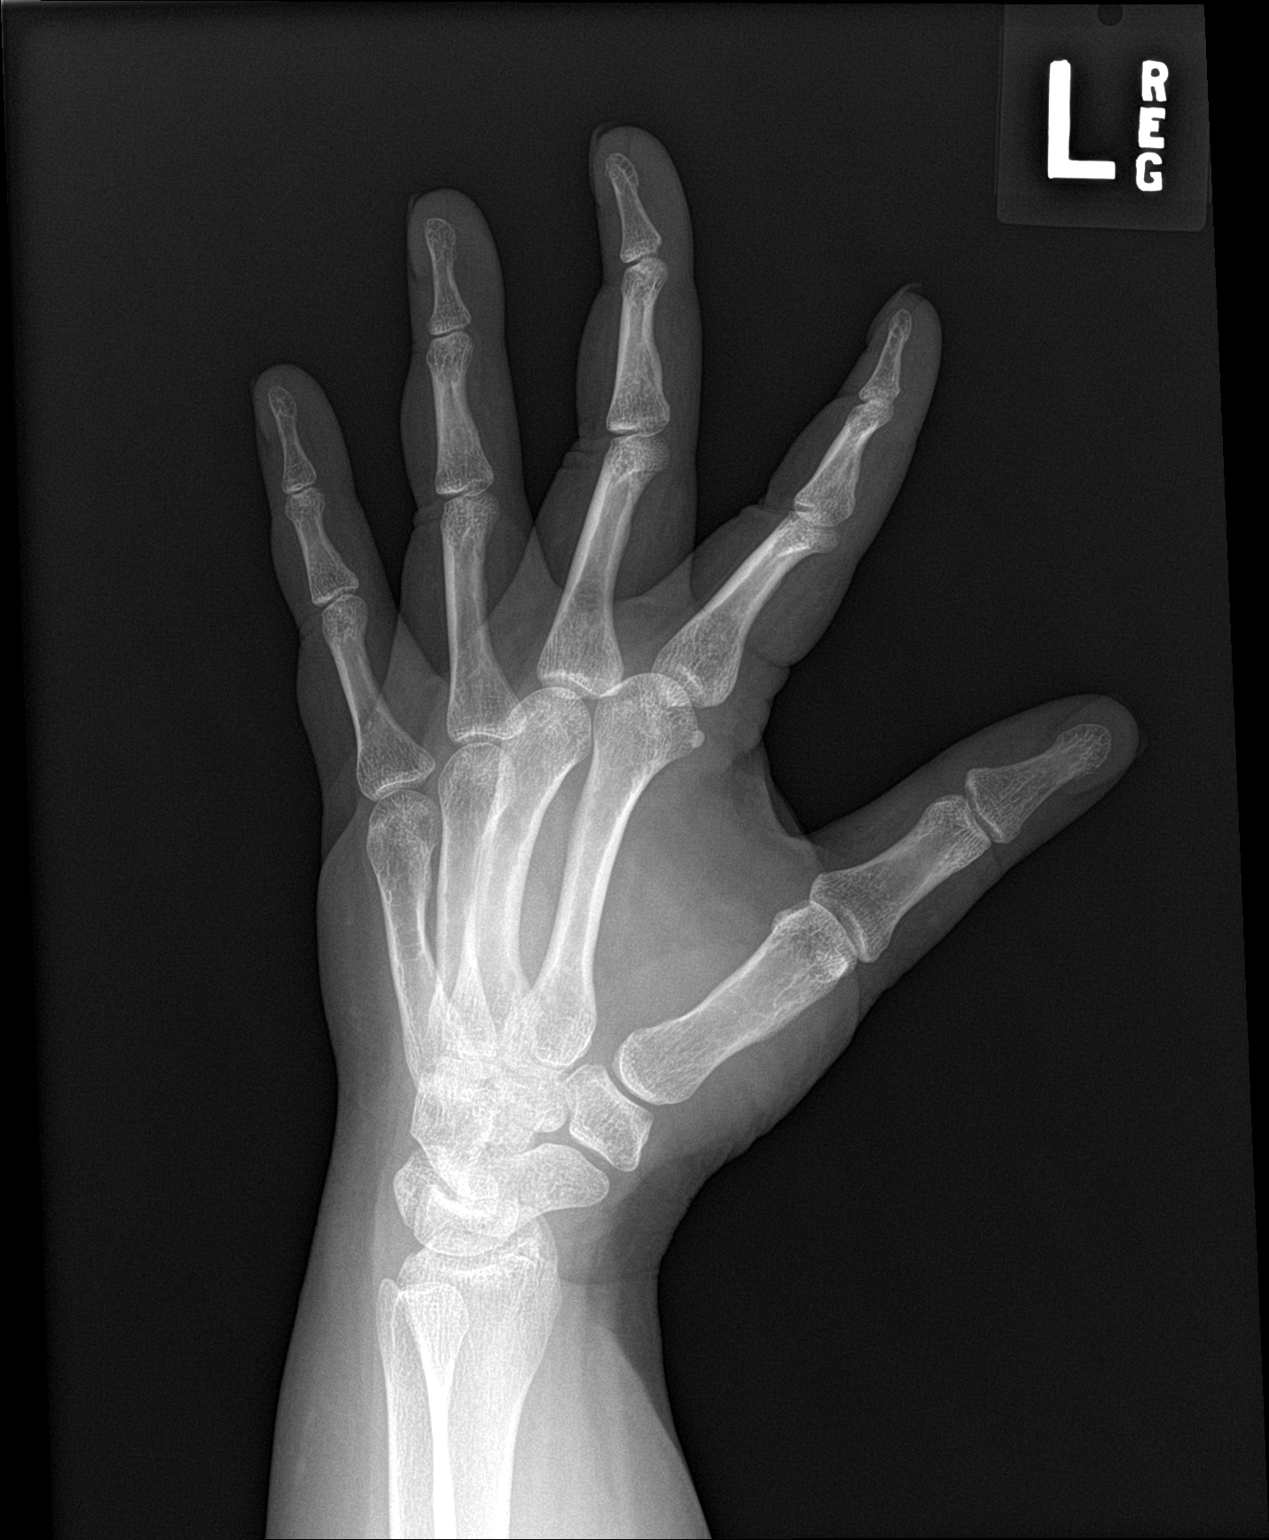

[hand lat]
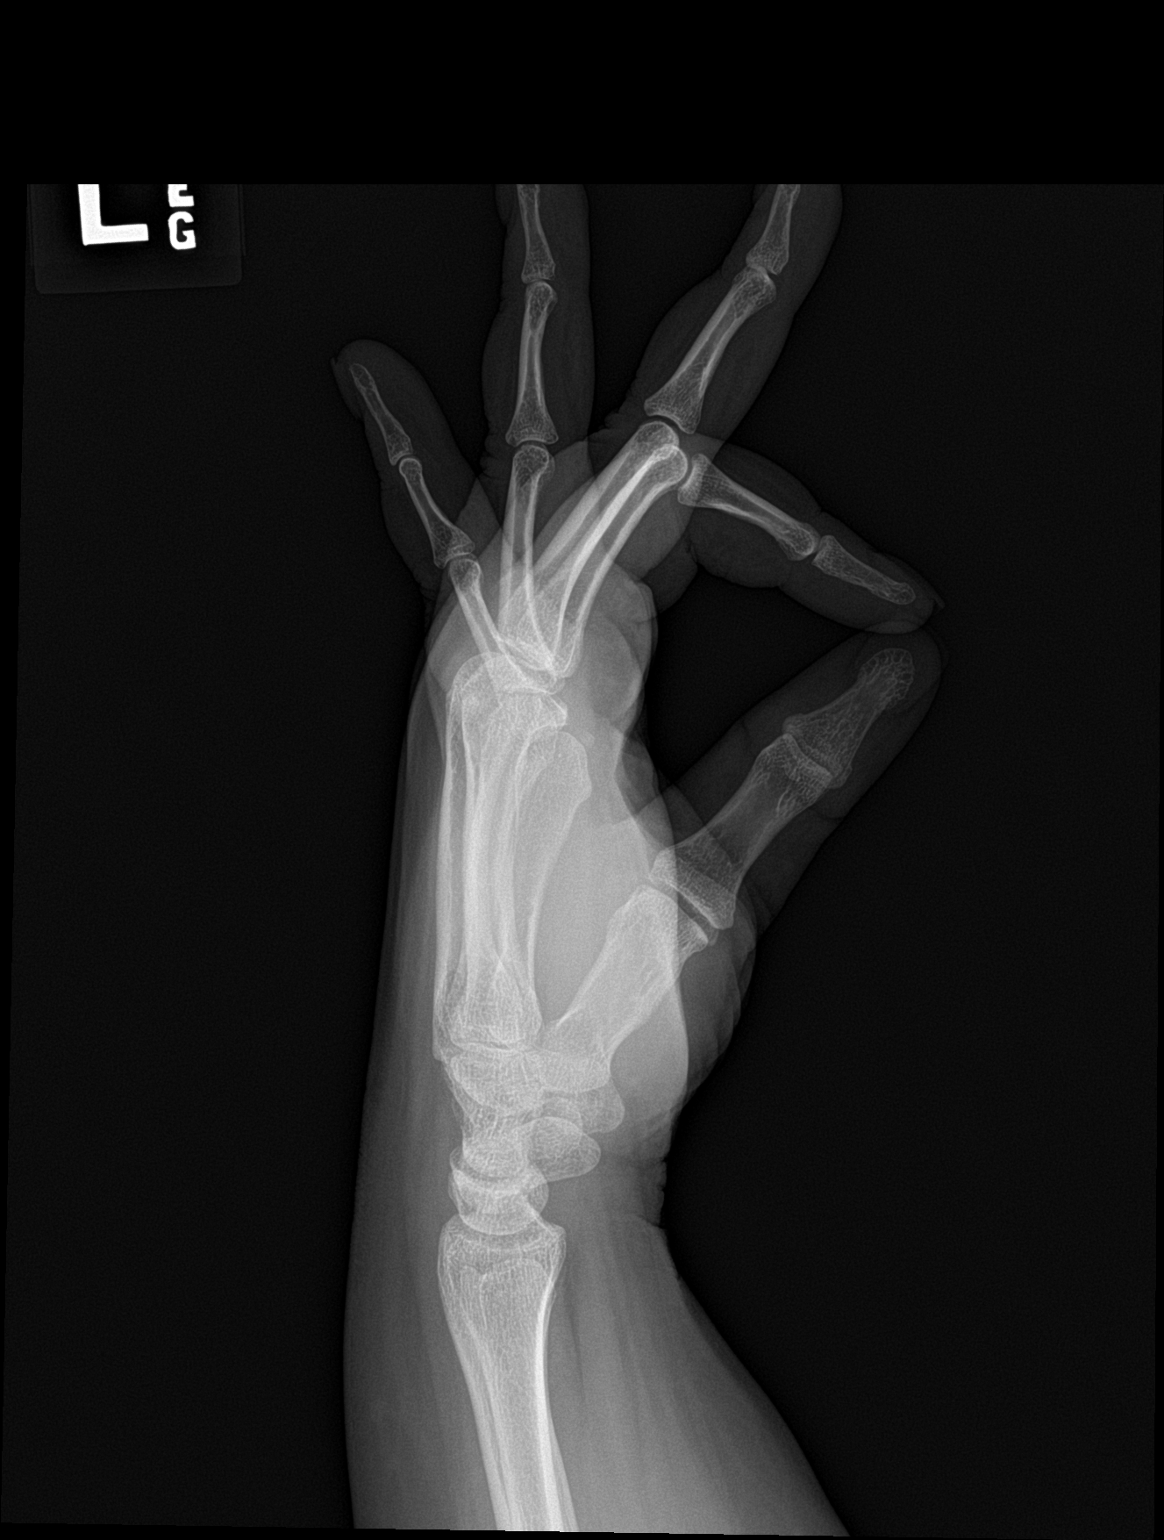

[3 of 3 positions shown; findings below may reference images not displayed]

FINDINGS: Diffuse soft tissue swelling. No radiopaque foreign body. No acute
bony or joint abnormality identified. No evidence of fracture or
dislocation.
IMPRESSION: Diffuse soft tissue swelling. No radiopaque foreign body. No acute
bony abnormality.
